# Patient Record
Sex: Female | Born: 1985 | Race: Black or African American | Hispanic: No | Marital: Married | Smoking: Never smoker
Health system: Southern US, Community
[De-identification: ages and names within clinical notes are randomized; demographics above are authoritative.]

## PROBLEM LIST (undated history)

## (undated) ENCOUNTER — Emergency Department (HOSPITAL_COMMUNITY): Admission: EM | Disposition: A | Discharge status: Home / Self Care | Payer: Medicaid Other

## (undated) DIAGNOSIS — I10 Essential (primary) hypertension: Secondary | ICD-10-CM

## (undated) DIAGNOSIS — G43909 Migraine, unspecified, not intractable, without status migrainosus: Secondary | ICD-10-CM

## (undated) DIAGNOSIS — Z789 Other specified health status: Secondary | ICD-10-CM

## (undated) HISTORY — DX: Migraine, unspecified, not intractable, without status migrainosus: G43.909

## (undated) HISTORY — DX: Essential (primary) hypertension: I10

---

## 2009-12-14 ENCOUNTER — Encounter: Admission: RE | Admit: 2009-12-14 | Discharge: 2009-12-14 | Payer: Self-pay | Admitting: Infectious Diseases

## 2010-10-29 ENCOUNTER — Inpatient Hospital Stay (HOSPITAL_COMMUNITY)
Admission: AD | Admit: 2010-10-29 | Discharge: 2010-10-29 | Disposition: A | Discharge status: Home / Self Care | Payer: Medicaid Other | Source: Ambulatory Visit | Attending: Obstetrics & Gynecology | Admitting: Obstetrics & Gynecology

## 2010-10-29 ENCOUNTER — Inpatient Hospital Stay (HOSPITAL_COMMUNITY): Payer: Medicaid Other

## 2010-10-29 DIAGNOSIS — O99891 Other specified diseases and conditions complicating pregnancy: Secondary | ICD-10-CM

## 2010-10-29 DIAGNOSIS — R109 Unspecified abdominal pain: Secondary | ICD-10-CM | POA: Insufficient documentation

## 2010-10-29 DIAGNOSIS — O9989 Other specified diseases and conditions complicating pregnancy, childbirth and the puerperium: Secondary | ICD-10-CM

## 2010-10-29 LAB — URINALYSIS, ROUTINE W REFLEX MICROSCOPIC
Bilirubin Urine: NEGATIVE
Hgb urine dipstick: NEGATIVE
Ketones, ur: NEGATIVE mg/dL
Protein, ur: NEGATIVE mg/dL
Urobilinogen, UA: 0.2 mg/dL (ref 0.0–1.0)

## 2010-10-29 LAB — WET PREP, GENITAL
Clue Cells Wet Prep HPF POC: NONE SEEN
Trich, Wet Prep: NONE SEEN
Yeast Wet Prep HPF POC: NONE SEEN

## 2010-10-29 LAB — CBC
MCV: 79.6 fL (ref 78.0–100.0)
RBC: 4.47 MIL/uL (ref 3.87–5.11)

## 2010-10-29 LAB — ABO/RH: ABO/RH(D): B POS

## 2010-12-05 LAB — HIV ANTIBODY (ROUTINE TESTING W REFLEX): HIV: NONREACTIVE

## 2010-12-05 LAB — ABO/RH: RH Type: NEGATIVE

## 2010-12-05 LAB — HEPATITIS B SURFACE ANTIGEN: Hepatitis B Surface Ag: NEGATIVE

## 2010-12-05 LAB — RUBELLA ANTIBODY, IGM: Rubella: IMMUNE

## 2010-12-05 LAB — RPR: RPR: NONREACTIVE

## 2010-12-07 LAB — GLUCOSE TOLERANCE, 1 HOUR: GTT, 1 hr: 121

## 2010-12-07 LAB — CULTURE, OB URINE

## 2011-01-12 ENCOUNTER — Inpatient Hospital Stay (HOSPITAL_COMMUNITY): Payer: Medicaid Other

## 2011-01-12 ENCOUNTER — Inpatient Hospital Stay (HOSPITAL_COMMUNITY): Payer: Medicaid Other | Admitting: Obstetrics & Gynecology

## 2011-01-12 ENCOUNTER — Encounter (HOSPITAL_COMMUNITY): Payer: Self-pay

## 2011-01-12 ENCOUNTER — Inpatient Hospital Stay (HOSPITAL_COMMUNITY)
Admission: AD | Admit: 2011-01-12 | Discharge: 2011-01-12 | Disposition: A | Discharge status: Home / Self Care | Payer: Medicaid Other | Source: Ambulatory Visit | Attending: Obstetrics & Gynecology | Admitting: Obstetrics & Gynecology

## 2011-01-12 DIAGNOSIS — Z3480 Encounter for supervision of other normal pregnancy, unspecified trimester: Secondary | ICD-10-CM

## 2011-01-12 DIAGNOSIS — R51 Headache: Secondary | ICD-10-CM | POA: Insufficient documentation

## 2011-01-12 DIAGNOSIS — J329 Chronic sinusitis, unspecified: Secondary | ICD-10-CM | POA: Insufficient documentation

## 2011-01-12 DIAGNOSIS — O99891 Other specified diseases and conditions complicating pregnancy: Secondary | ICD-10-CM | POA: Insufficient documentation

## 2011-01-12 DIAGNOSIS — Z331 Pregnant state, incidental: Secondary | ICD-10-CM

## 2011-01-12 DIAGNOSIS — T7840XA Allergy, unspecified, initial encounter: Secondary | ICD-10-CM

## 2011-01-12 LAB — URINALYSIS, ROUTINE W REFLEX MICROSCOPIC
Bilirubin Urine: NEGATIVE
Glucose, UA: NEGATIVE mg/dL
Leukocytes, UA: NEGATIVE
Nitrite: NEGATIVE
Protein, ur: NEGATIVE mg/dL
Specific Gravity, Urine: <1.005 — ABNORMAL LOW (ref 1.005–1.030)
Urobilinogen, UA: 0.2 mg/dL (ref 0.0–1.0)
pH: 7 (ref 5.0–8.0)

## 2011-01-12 LAB — DIFFERENTIAL
Basophils Relative: 0 % (ref 0–1)
Eosinophils Absolute: 0.1 10*3/uL (ref 0.0–0.7)
Lymphocytes Relative: 22 % (ref 12–46)
Monocytes Relative: 9 % (ref 3–12)
Neutro Abs: 5.2 10*3/uL (ref 1.7–7.7)

## 2011-01-12 LAB — COMPREHENSIVE METABOLIC PANEL
ALT: 11 U/L (ref 0–35)
Calcium: 9.3 mg/dL (ref 8.4–10.5)
Creatinine, Ser: 0.55 mg/dL (ref 0.50–1.10)
GFR calc non Af Amer: >60 mL/min (ref >60)
Total Bilirubin: 0.1 mg/dL — ABNORMAL LOW (ref 0.3–1.2)

## 2011-01-12 LAB — CBC
MCH: 27.7 pg (ref 26.0–34.0)
MCHC: 34.1 g/dL (ref 30.0–36.0)
RDW: 13.7 % (ref 11.5–15.5)

## 2011-01-12 LAB — URINE MICROSCOPIC-ADD ON

## 2011-01-12 MED ORDER — DIPHENHYDRAMINE HCL 25 MG PO CAPS
50.0000 mg | ORAL_CAPSULE | Freq: Once | ORAL | Status: AC
  Administered 2011-01-12: 50 mg via ORAL
  Filled 2011-01-12: qty 2

## 2011-01-12 MED ORDER — PREDNISONE (PAK) 10 MG PO TABS: 20.0000 mg | ORAL_TABLET | Freq: Every day | ORAL | Status: DC

## 2011-01-12 MED ORDER — PREDNISONE 20 MG PO TABS
60.0000 mg | ORAL_TABLET | Freq: Once | ORAL | Status: AC
  Administered 2011-01-12: 60 mg via ORAL
  Filled 2011-01-12: qty 3

## 2011-01-12 MED ORDER — DIPHENHYDRAMINE HCL 25 MG PO CAPS: 25.0000 mg | ORAL_CAPSULE | Freq: Four times a day (QID) | ORAL | Status: AC | PRN

## 2011-01-12 NOTE — ED Provider Notes (Signed)
History   The patient speaks some English and does not feel she needs a Nurse, learning disability. She only ask that we speak slowly.  Chief Complaint  Patient presents with  . Sinusitis  . Headache    pt reports facial swelling started on 07/05 and headache started at this time. has had nasal congestion and sneezing and headache is frontal. denies toothache, earache.denies sorethroat   Patient is a 25 y.o. female presenting with sinusitis and headaches. The history is provided by the patient. The history is limited by a language barrier. No language interpreter was used.  Sinusitis  This is a new problem. The problem has been gradually worsening. The maximum temperature recorded prior to her arrival was 100 to 100.9 F. The fever has been present for 5 days or more. The pain is at a severity of 6/10. The pain is moderate. The pain has been constant since onset. Associated symptoms include chills, congestion, sinus pressure and cough. Pertinent negatives include no ear pain and no sore throat. She has tried nothing for the symptoms.  Headache The primary symptoms include headaches and fever. Primary symptoms do not include nausea or vomiting.  The headache is associated with photophobia and eye pain. The headache is not associated with double vision.  Additional symptoms include photophobia.  The headache is located in the left frontal and maxillary area.     No past medical history on file.  Past Surgical History  Procedure Date  . Cesarean section     No family history on file.  History  Substance Use Topics  . Smoking status: Never Smoker   . Smokeless tobacco: Not on file  . Alcohol Use: No    Allergies: No Known Allergies  No prescriptions prior to admission    Review of Systems  Constitutional: Positive for fever and chills.  HENT: Positive for congestion and sinus pressure. Negative for ear pain, nosebleeds, sore throat, neck pain and ear discharge.   Eyes: Positive for photophobia  and pain. Negative for blurred vision and double vision.  Respiratory: Positive for cough.   Gastrointestinal: Negative for nausea, vomiting and abdominal pain.  Genitourinary: Negative for dysuria and urgency.  Musculoskeletal: Positive for myalgias. Negative for back pain.  Skin: Negative for rash.  Neurological: Positive for headaches.   Physical Exam   Blood pressure 145/82, pulse 104, temperature 99.1 F (37.3 C), temperature source Oral, height 5\' 4"  (1.626 m), weight 215 lb 3.2 oz (97.614 kg).  Physical Exam  Constitutional: She is oriented to person, place, and time. She appears well-developed and well-nourished.  HENT:  Head: Head is with left periorbital erythema.  Right Ear: External ear normal. No drainage, swelling or tenderness. Tympanic membrane is not perforated, not erythematous and not bulging.  Left Ear: External ear normal. No drainage, swelling or tenderness. Tympanic membrane is not perforated, not erythematous and not bulging.  Nose: Mucosal edema, rhinorrhea and sinus tenderness present. No epistaxis.  No foreign bodies. Right sinus exhibits no maxillary sinus tenderness and no frontal sinus tenderness. Left sinus exhibits maxillary sinus tenderness and frontal sinus tenderness.  Eyes: EOM are normal. Right eye exhibits no discharge, no exudate and no hordeolum. Left eye exhibits no discharge, no exudate and no hordeolum. Right conjunctiva is not injected. Left conjunctiva is injected. Right eye exhibits normal extraocular motion and no nystagmus. Left eye exhibits normal extraocular motion and no nystagmus.    Neck: Neck supple.  Respiratory: Effort normal.  GI: Soft. There is no tenderness.  Gravid at 19.3 wks. Gest +FHT  Musculoskeletal: Normal range of motion. She exhibits no edema.  Neurological: She is alert and oriented to person, place, and time. She has normal strength and normal reflexes. No cranial nerve deficit. Coordination normal.  Skin: Skin is  warm and dry.    MAU Course  Procedures Dr. Penne Lash in to evaluate patient with me. We will order labs, CT of sinuses, benadryl and prednisone.  MDM Pt. Feeling better after benadryl and prednisone. Discussed with Dr. Penne Lash and CT results normal. Will d/c pt. Home to follow up with her PCP tomorrow. Will tx with prednisone and benadryl Rx.    [place], NP 01/12/11 [address] [place] 01/12/11 1718

## 2011-01-12 NOTE — Consult Note (Signed)
Dr. Penne Lash in MAU and evaluated patient with me. Agrees with assessment and current plan of care. Pt. Awaiting CT of sinuses.

## 2011-01-12 NOTE — Progress Notes (Signed)
Left eye and left side of face swelling, headaches, weakness

## 2011-01-23 ENCOUNTER — Encounter (HOSPITAL_COMMUNITY): Payer: Self-pay | Admitting: Advanced Practice Midwife

## 2011-01-23 ENCOUNTER — Inpatient Hospital Stay (HOSPITAL_COMMUNITY)
Admission: AD | Admit: 2011-01-23 | Discharge: 2011-01-23 | Disposition: A | Discharge status: Home / Self Care | Payer: Medicaid Other | Source: Ambulatory Visit | Attending: Obstetrics & Gynecology | Admitting: Obstetrics & Gynecology

## 2011-01-23 DIAGNOSIS — O10019 Pre-existing essential hypertension complicating pregnancy, unspecified trimester: Secondary | ICD-10-CM | POA: Insufficient documentation

## 2011-01-23 DIAGNOSIS — O169 Unspecified maternal hypertension, unspecified trimester: Secondary | ICD-10-CM

## 2011-01-23 LAB — COMPREHENSIVE METABOLIC PANEL
Albumin: 3.1 g/dL — ABNORMAL LOW (ref 3.5–5.2)
BUN: 5 mg/dL — ABNORMAL LOW (ref 6–23)
Chloride: 99 mEq/L (ref 96–112)
Creatinine, Ser: 0.55 mg/dL (ref 0.50–1.10)
GFR calc Af Amer: >60 mL/min (ref >60)
GFR calc non Af Amer: >60 mL/min (ref >60)
Glucose, Bld: 97 mg/dL (ref 70–99)
Potassium: 4.1 mEq/L (ref 3.5–5.1)
Sodium: 133 mEq/L — ABNORMAL LOW (ref 135–145)

## 2011-01-23 LAB — CBC
HCT: 36.4 % (ref 36.0–46.0)
MCH: 27.6 pg (ref 26.0–34.0)
Platelets: 202 10*3/uL (ref 150–400)
RBC: 4.45 MIL/uL (ref 3.87–5.11)
RDW: 13.9 % (ref 11.5–15.5)
WBC: 8.9 10*3/uL (ref 4.0–10.5)

## 2011-01-23 NOTE — Progress Notes (Signed)
PT WAS AT [facility] AND WAS NOTED TO HAVE ELEVATED B/P. WAS SENT TO MAU FOR FURTHER EVALUATION.

## 2011-01-23 NOTE — Progress Notes (Signed)
Since Mon, having difficulty breathing.  Has been sweating a lot.  BP checked at work, told BP and pulse both high. Denies hx of HTN.

## 2011-01-24 ENCOUNTER — Telehealth: Payer: Self-pay | Admitting: Family Medicine

## 2011-01-24 ENCOUNTER — Other Ambulatory Visit: Payer: Self-pay | Admitting: Obstetrics & Gynecology

## 2011-01-24 DIAGNOSIS — O10919 Unspecified pre-existing hypertension complicating pregnancy, unspecified trimester: Secondary | ICD-10-CM

## 2011-01-24 NOTE — Telephone Encounter (Signed)
Pt returned 24-hr urine to MAU as instructed and needed order placed in [facility] for processing. [employer] opened to do this.

## 2011-02-03 ENCOUNTER — Encounter (HOSPITAL_COMMUNITY): Payer: Self-pay | Admitting: *Deleted

## 2011-02-03 ENCOUNTER — Inpatient Hospital Stay (HOSPITAL_COMMUNITY)
Admission: AD | Admit: 2011-02-03 | Discharge: 2011-02-03 | Disposition: A | Discharge status: Home / Self Care | Payer: Medicaid Other | Source: Ambulatory Visit | Attending: Obstetrics and Gynecology | Admitting: Obstetrics and Gynecology

## 2011-02-03 DIAGNOSIS — N949 Unspecified condition associated with female genital organs and menstrual cycle: Secondary | ICD-10-CM | POA: Insufficient documentation

## 2011-02-03 DIAGNOSIS — O9989 Other specified diseases and conditions complicating pregnancy, childbirth and the puerperium: Secondary | ICD-10-CM | POA: Insufficient documentation

## 2011-02-03 DIAGNOSIS — R1032 Left lower quadrant pain: Secondary | ICD-10-CM | POA: Insufficient documentation

## 2011-02-03 HISTORY — DX: Other specified health status: Z78.9

## 2011-02-03 LAB — URINALYSIS, ROUTINE W REFLEX MICROSCOPIC
Bilirubin Urine: NEGATIVE
Hgb urine dipstick: NEGATIVE
Specific Gravity, Urine: 1.01 (ref 1.005–1.030)
Urobilinogen, UA: 0.2 mg/dL (ref 0.0–1.0)
pH: 7.5 (ref 5.0–8.0)

## 2011-02-03 NOTE — Progress Notes (Signed)
Ok when sits, hurts and feel pain when stands.  Pain is lower abd  On left side.

## 2011-02-03 NOTE — ED Provider Notes (Signed)
History     Chief Complaint  Patient presents with  . Abdominal Pain   HPI  Presents at 22 weeks. with c/o intermittent sharp pains on LLQ.  States it only hurts when she walks. Does not hurt at other times. Denies leaking or bleeding. Denies contractions. [facility] No complications with pregnancy other than Hypertension.   Past Medical History  Diagnosis Date  . No pertinent past medical history     Past Surgical History  Procedure Date  . Cesarean section     No family history on file.  History  Substance Use Topics  . Smoking status: Never Smoker   . Smokeless tobacco: Not on file  . Alcohol Use: No    Allergies: No Known Allergies  Prescriptions prior to admission  Medication Sig Dispense Refill  . acetaminophen (TYLENOL) 500 MG tablet Take 1,000 mg by mouth every 6 (six) hours as needed. Pain        . prenatal vitamin w/FE, FA (PRENATAL 1 + 1) 27-1 MG TABS Take 1 tablet by mouth daily.          ROS Intermittent sharp LLQ pains. Denies leaking or bleeding.  Physical Exam   Blood pressure 145/89, pulse 119, temperature 98.5 F (36.9 C), temperature source Oral, resp. rate 20, height 5\' 4"  (1.626 m), weight 100.517 kg (221 lb 9.6 oz).  Physical Exam  Respirations unlabored. Abdomen soft and nontender.  Gravid in contour. Fundal Height 22cm Very slight tenderness in LLQ with deep palpation. FHR 150s. Cervix long and closed No discharge or bleeding.    MAU Course  Procedures   Assessment and Plan  IUP at 22 weeks Round Ligament Pain No evidence of preterm labor  Plan:  WIll d/c home with comfort measures reviewed.  Wynelle Bourgeois 02/03/2011, 2:28 PM

## 2011-02-04 NOTE — ED Provider Notes (Signed)
Agree with above note.  Ann Frey 02/04/2011 7:11 AM

## 2011-02-17 NOTE — ED Provider Notes (Signed)
Seen previous day for round ligament pain. See prior note.

## 2011-02-28 ENCOUNTER — Encounter: Payer: Self-pay | Admitting: Pulmonary Disease

## 2011-02-28 DIAGNOSIS — I10 Essential (primary) hypertension: Secondary | ICD-10-CM

## 2011-02-28 NOTE — Progress Notes (Signed)
Pt needs to see Nutrition and Child psychotherapist at visit.

## 2011-03-03 ENCOUNTER — Ambulatory Visit (INDEPENDENT_AMBULATORY_CARE_PROVIDER_SITE_OTHER): Payer: Medicaid Other | Admitting: Family Medicine

## 2011-03-03 ENCOUNTER — Other Ambulatory Visit: Payer: Self-pay | Admitting: Obstetrics and Gynecology

## 2011-03-03 DIAGNOSIS — O0990 Supervision of high risk pregnancy, unspecified, unspecified trimester: Secondary | ICD-10-CM

## 2011-03-03 DIAGNOSIS — O169 Unspecified maternal hypertension, unspecified trimester: Secondary | ICD-10-CM

## 2011-03-03 NOTE — Progress Notes (Signed)
[facility] for PIH.  Had 24 hour urine, PIH baseline labs done earlier this week at [facility].  Will request records.   Previous delivery by c-section in [place].  Not certain of reason why.   Occasional HA.  Denies vision changes, vag discharge.

## 2011-03-03 NOTE — Progress Notes (Signed)
Nutrition Note: Referred for 1st [facility] visit, Dx. PIH, Obesity; patient has WIC. Current weight gain of 29.3# is excessive, plots 16# > expected for weeks gest. Recommended weight gain 11-20#. Intake- Reports 3 meals and 3 snacks, well balanced diet, does not add sodium to foods. Reports plenty of water and limited high sugar beverages.  Reports no physical activity. Plans to increase activity to 3 times/week as able. Will limit intake to 5 smaller meals.  Follow up if referred. Cy Blamer, RD

## 2011-03-03 NOTE — Progress Notes (Signed)
Pt needs to see nutrition and Child psychotherapist. Pt states had stomach ache on Sat and pain in lower abdomen on Sunday. No vaginal discharge. Pulse 126.

## 2011-03-12 ENCOUNTER — Encounter: Payer: Self-pay | Admitting: Family Medicine

## 2011-03-17 ENCOUNTER — Encounter: Payer: Self-pay | Admitting: Obstetrics & Gynecology

## 2011-03-17 ENCOUNTER — Ambulatory Visit (INDEPENDENT_AMBULATORY_CARE_PROVIDER_SITE_OTHER): Payer: Medicaid Other | Admitting: Advanced Practice Midwife

## 2011-03-17 DIAGNOSIS — O10919 Unspecified pre-existing hypertension complicating pregnancy, unspecified trimester: Secondary | ICD-10-CM

## 2011-03-17 DIAGNOSIS — O0990 Supervision of high risk pregnancy, unspecified, unspecified trimester: Secondary | ICD-10-CM

## 2011-03-17 DIAGNOSIS — O169 Unspecified maternal hypertension, unspecified trimester: Secondary | ICD-10-CM

## 2011-03-17 LAB — CBC
HCT: 37.3 % (ref 36.0–46.0)
Hemoglobin: 12.5 g/dL (ref 12.0–15.0)
MCH: 27.4 pg (ref 26.0–34.0)
MCHC: 33.5 g/dL (ref 30.0–36.0)
MCV: 81.8 fL (ref 78.0–100.0)
Platelets: 216 10*3/uL (ref 150–400)
RBC: 4.56 MIL/uL (ref 3.87–5.11)
RDW: 13.3 % (ref 11.5–15.5)
WBC: 6.9 10*3/uL (ref 4.0–10.5)

## 2011-03-17 LAB — POCT URINALYSIS DIP (DEVICE)
Bilirubin Urine: NEGATIVE
Hgb urine dipstick: NEGATIVE
Ketones, ur: NEGATIVE mg/dL
Leukocytes, UA: NEGATIVE
Protein, ur: NEGATIVE mg/dL
pH: 6.5 (ref 5.0–8.0)

## 2011-03-17 LAB — RPR

## 2011-03-17 LAB — GLUCOSE TOLERANCE, 1 HOUR: Glucose, 1 Hour GTT: 166 mg/dL — ABNORMAL HIGH (ref 70–140)

## 2011-03-17 LAB — HIV ANTIBODY (ROUTINE TESTING W REFLEX): HIV: NONREACTIVE

## 2011-03-17 MED ORDER — LABETALOL HCL 100 MG PO TABS: 100.0000 mg | ORAL_TABLET | Freq: Two times a day (BID) | ORAL | Status: DC

## 2011-03-17 MED ORDER — RHO D IMMUNE GLOBULIN 300 MCG IM INJ: 300.0000 ug | INJECTION | Freq: Once | INTRAMUSCULAR | Status: DC

## 2011-03-17 NOTE — Progress Notes (Signed)
No vaginal discharge. Pulse 116. When standing for long time, pt states hard for her to breathe.

## 2011-03-17 NOTE — Patient Instructions (Addendum)
Pregnancy - Third Trimester The third trimester begins at the 28th week of pregnancy and ends at birth. It is important to follow your doctor's instructions. HOME CARE  Keep your doctor's appointments.   Do not smoke.   Do not drink alcohol or use drugs.   Only take medicine the doctor tells you to take.   Take prenatal vitamins as directed. The vitamin should contain 1 milligram of folic acid.   Exercise.   Eat healthy foods. Eat regular, well-balanced meals.   You can have sex (intercourse) if there are no other problems with the pregnancy.   Do not use hot tubs, steam rooms, or saunas.   Wear a seat belt while driving.   Avoid raw meat, uncooked cheese, and litter boxes and soil used by cats.   Rest with your legs raised (elevated).   Make a list of emergency phone numbers. Keep this list with you.   Arrange for help when you come back home after delivering the baby.   Make a trial run to the hospital.   Take prenatal classes.   Prepare the baby's nursery.   Do not travel out of the city. If you absolutely have to, get permission from your doctor first.   Wear flat shoes. Do not wear high heels.  GET HELP IF:  You have any concerns or worries during your pregnancy.  GET HELP RIGHT AWAY IF:  You have a temperature by mouth above 101.0, not controlled by medicine.   You have not felt the baby move for more than 1 hour. If you think the baby is not moving as much as normal, eat something with sugar in it or lie down on your left side for an hour. The baby should move at least 4 to 5 times per hour.   Fluid is coming from the vagina.   Blood is coming from the vagina. Light spotting is common, especially after sex (intercourse).   You have belly (abdominal) pain.   You have a bad smelling fluid (discharge) coming from the vagina. The fluid changes from clear to white.   You still feel sick to your stomach (nauseous).   You throw up (vomit) more than 24  hours.   You have the chills.   You have shortness of breath.   You have a burning feeling when you pee (urinate).   You loose or gain more than 2 pounds (0.9 kilograms) of weight over a weeks time, or as suggested by your doctor.   Your face, hands, feet, or legs get puffy (swell).   You have a bad headache that will not go away.   You start to have problems seeing (blurry or double vision).   You fall, are in a car accident, or have any kind of trauma.   There is mental or physical violence at home.  MAKE SURE YOU:   Understand these instructions.   Will watch your condition.   Will get help right away if you are not doing well or get worse.  Document Released: 09/17/2009  [facility] Patient Information 2011 [place], [place]. Hypertension In Pregnancy High Blood Pressure in Pregnancy You can develop high blood pressure (hypertension) in the last half of your pregnancy (20 weeks of gestation or later). This can happen even if your blood pressure was normal before pregnancy. This condition is usually monitored closely because pregnancy-induced hypertension (PIH) can be associated with more serious problems. PIH can be a sign of a complication of pregnancy called preeclampsia.  This is especially true if you have protein in your urine. If preeclampsia progresses, it can cause convulsions during your pregnancy (eclampsia). It can also become HELLP (hemolysis, elevated liver enzymes, and low platelet count) syndrome, which can lead to abnormalities of the liver, abnormalities of blood clotting, increased high blood pressure, and increased protein in the urine. Both conditions are very serious and can threaten the life of a mother and her baby. A woman who has high blood pressure before getting pregnant can also get superimposed preeclampsia, which is usually a more severe form of preeclampsia. HOME CARE INSTRUCTIONS  Make sure you understand these instructions and ask your caregiver any  questions you have.   Follow your caregiver's instructions carefully.   Take medications as directed by your caregiver.  SEEK IMMEDIATE MEDICAL CARE IF YOU HAVE:  Stomach pain.   Sudden and excessive swelling in the hands, ankles, or face.   Weight gain of 4 pounds (1.8 kg) or more within 1 week.   Repeated vomiting.   Vaginal bleeding.   A lack of fetal movement.   Headache.   Vision problems (blurred or double vision).   Muscle twitching or spasm.   Shortness of breath.   Blue fingernails and lips.   Blood in the urine.  Document Released: 06/23/2005 Document Re-Released: 12/11/2009 [facility] Patient Information 2011 [place], [place].

## 2011-03-17 NOTE — Progress Notes (Addendum)
Pt reports some occasional shortness of breath, no dyspnea, resolves with rest/taking off clothes. Rev'd normal shortness of breath in late pregnancy vs. Warning signs, rev'd comfort measures. 28 wk labs and rhogam today. U/S ordered. Discussed VBAC vs. Rpt c/s - pt interested in TOLAC. BP elevated. No headache, vision change, abd pain. Start Labetalol 100mg  bid. Rev'd with Dr. Debroah Loop, agrees with plan.

## 2011-03-17 NOTE — Progress Notes (Addendum)
Addended by: Archie Patten on: 03/17/2011 09:26 AM  Modules accepted: Orders

## 2011-03-19 ENCOUNTER — Telehealth: Payer: Self-pay | Admitting: *Deleted

## 2011-03-19 NOTE — Telephone Encounter (Signed)
Message copied by Jill Side on Wed Mar 19, 2011  9:38 AM ------      Message from: Archie Patten      Created: Wed Mar 19, 2011  9:19 AM      Regarding: Needs 3 hour GTT       Please call patient, needs 3 hour GTT      ----- Message -----         From: [facility] [place] Interface         Sent: 03/17/2011   5:12 PM           To: Georges Mouse, CNM

## 2011-03-19 NOTE — Telephone Encounter (Signed)
Called pt- discussed need for 3hr GTT. Pt will come in tomorrow @ 0800 for test. She voiced understanding of instructions.

## 2011-03-20 ENCOUNTER — Other Ambulatory Visit: Payer: Medicaid Other

## 2011-03-21 LAB — GLUCOSE TOLERANCE, 3 HOURS
Glucose Tolerance, 1 hour: 162 mg/dL (ref 70–189)
Glucose Tolerance, 2 hour: 134 mg/dL (ref 70–164)
Glucose Tolerance, Fasting: 94 mg/dL (ref 70–104)

## 2011-03-24 ENCOUNTER — Ambulatory Visit (HOSPITAL_COMMUNITY)
Admission: RE | Admit: 2011-03-24 | Discharge: 2011-03-24 | Disposition: A | Discharge status: Home / Self Care | Payer: Medicaid Other | Source: Ambulatory Visit | Attending: Advanced Practice Midwife | Admitting: Advanced Practice Midwife

## 2011-03-24 ENCOUNTER — Ambulatory Visit (INDEPENDENT_AMBULATORY_CARE_PROVIDER_SITE_OTHER): Payer: Medicaid Other | Admitting: Obstetrics and Gynecology

## 2011-03-24 VITALS — BP 133/83 | Temp 97.1°F | Wt 225.5 lb

## 2011-03-24 DIAGNOSIS — O10919 Unspecified pre-existing hypertension complicating pregnancy, unspecified trimester: Secondary | ICD-10-CM

## 2011-03-24 DIAGNOSIS — O0990 Supervision of high risk pregnancy, unspecified, unspecified trimester: Secondary | ICD-10-CM

## 2011-03-24 DIAGNOSIS — O34219 Maternal care for unspecified type scar from previous cesarean delivery: Secondary | ICD-10-CM | POA: Insufficient documentation

## 2011-03-24 DIAGNOSIS — O169 Unspecified maternal hypertension, unspecified trimester: Secondary | ICD-10-CM

## 2011-03-24 DIAGNOSIS — O10019 Pre-existing essential hypertension complicating pregnancy, unspecified trimester: Secondary | ICD-10-CM | POA: Insufficient documentation

## 2011-03-24 DIAGNOSIS — O360990 Maternal care for other rhesus isoimmunization, unspecified trimester, not applicable or unspecified: Secondary | ICD-10-CM

## 2011-03-24 DIAGNOSIS — O26899 Other specified pregnancy related conditions, unspecified trimester: Secondary | ICD-10-CM

## 2011-03-24 DIAGNOSIS — Z6791 Unspecified blood type, Rh negative: Secondary | ICD-10-CM

## 2011-03-24 LAB — POCT URINALYSIS DIP (DEVICE)
Ketones, ur: NEGATIVE mg/dL
Leukocytes, UA: NEGATIVE
Nitrite: NEGATIVE
Protein, ur: NEGATIVE mg/dL
Urobilinogen, UA: 0.2 mg/dL (ref 0.0–1.0)
pH: 7 (ref 5.0–8.0)

## 2011-03-24 MED ORDER — RHO D IMMUNE GLOBULIN 1500 UNIT/2ML IJ SOLN: 300.0000 ug | Freq: Once | INTRAMUSCULAR | Status: DC

## 2011-03-24 NOTE — Progress Notes (Signed)
Pt is B negative and has never received Rhogam. Pt is unaware of this. No vaginal discharge. Pulse -108 Pt did receive 28 week labs but no rhogam shot.

## 2011-03-24 NOTE — Progress Notes (Signed)
Patient without complaints. FM/PTL precautions reviewed. Results of ultrasound and 3hr GTT also reviewed. Patient with 2 different Rh status on file. Will repeat again today before giving Rhogam. Patient did not receive Rhogma with last pregnancy and stated that her blood type was B+. Patient provided information on TOLAC. Patient will decide at her next visit.

## 2011-03-24 NOTE — Patient Instructions (Addendum)
Pregnancy - Hypertension                      (High Blood Pressure, Preeclampsia)                              High blood pressure (hypertension) that develops after 20 weeks of pregnancy is called preeclampsia. Preeclampsia is a blood pressure that is 140 or higher systolic, or 90 or higher diastolic that develops after 20 weeks of pregnancy along with protein (0.3 grams or higher in 24 hours) in the urine. Preeclampsia can be life-threatening to the mother and baby. Babies are smaller and more likely to be born prematurely when the mother's blood pressure is elevated. In extreme cases, the mother may die or the baby may die while still in the uterus. In preeclampsia, the rise in blood pressure is caused by the pregnancy and returns to normal after the baby is delivered.   Preeclampsia is known by other names including:  Toxemia of pregnancy.  Eclampsia (high blood pressure with convulsions).  Pregnancy induced hypertension.   Gestational hypertension is used to replace the term "pregnancy induced hypertension."  HEELP Syndrome. HEELP syndrome is present when: l The systolic blood pressure is 160 or higher or the diastolic blood pressure is 110 or higher on two occasions within 6 hours during bed rest. l The 24 hour protein in the urine is 5 grams or 3+ or higher on two random samples. l The 24 hour urine out put is less than 500 ml. l There are cerebral (severe headache) or visual problems (blurred or double vision). l There is cyanosis (blue lips and finger nails) because of the lungs filling up with fluid (pulmonary edema). l There is upper abdominal pain. l There are abnormal liver function tests. l There is a low platelet count. l The baby is not growing normally. l There is abruption (premature separation) of the placenta.  High blood pressure that was present before the pregnancy (chronic hypertension) and preeclampsia are different conditions. Chronic hypertension continues  following delivery. With chronic hypertension, there is no protein in the urine. You can get superimposed preeclampsia with chronic hypertension, and this puts the pregnancy at more risk.   CAUSES The exact cause of preeclampsia is unknown. It may be a  immune reaction in the body and it also may be related to trophoblastic cells that invade the placenta (the more the invasion the higher the blood pressure). Preeclampsia is a much more dangerous condition than chronic hypertension. With preeclampsia there are more changes in the mother's body than just having chronic high blood pressure. There is a shift in chemical makeup. This shift can lead to blood clotting, seizures,  problems in the mother and more as stated in HEELP Syndrome and also problems with the fetus, low birth weight, premature birth, seizures and death in the pregnant patient or the fetus.   RISK FACTORS THAT MAY LEAD TO PREECLAMPSIA:  First pregnancy.  Having preeclampsia in a previous pregnancy.  Having chronic hypertension.  Having twins or more with pregnancy.  Being over age 58 when getting pregnant.  Being African American.  If you have kidney disease.   If you have diabetes.  If you have lupus or other blood diseases.  If you are obese.    SYMPTOMS The most common symptoms found in patients with preeclampsia are:  High blood pressure.  Swelling of the hands, face  and brain (edema).  Losing protein in the urine.  Overactive reflexes (hyperreflexia).  Increase uric acid in the blood.  Sudden weight gain (5 to 10 pounds in one week).  Dizziness.  Increased deep tendon reflexes (the knee-jerk).  Visual problems.  Abdominal pain.  Headache.  Nausea and vomiting.  Numbness in the face, hands and feet.  Slurred speech.  The symptoms listed above are serious and must be evaluated before they cause several other problems. These include:   Seizures.    Trouble breathing.  Death of the mother or  fetus.   Low urine output (oliguria).  Low platelet count.   Not enough fluid in the amniotic sac (Oligohydramnios).  Bleeding because blood is not clotting normally (low platelet count).  Abnormal liver function.  Hemolysis, breakdown of red blood cells.  Retinal detachment.  Brain hemorrhage.    DIAGNOSIS   A rise in the systolic number (the first number) of 30 or more and/or a rise of the diastolic number (the second number) of 15 or more can mean a problem is developing. A blood pressure that usually runs about 100/60 and then goes up to 130/90 is a concern. If this happens, the mother needs more checking, blood and other tests need to be performed and the baby needs to be monitored. You also need to be watched closely and even admitted into the hospital if necessary.  A 24-hour urine test will likely be done. This test measures how much protein is in the urine. The amount of protein in the urine increases due to the changes in the kidneys. Measuring the protein helps determine how bad the problem may be.  An increase in the uric acid in the blood may indicate preeclampsia.  Blood tests may show blood clotting abnormalities and liver function to be impaired. This can be a sign of a much more serious problems known as the HELLP syndrome.  Over active reflexes are also another way of knowing preeclampsia  is developing.  Patients with pre-existing hypertension who also get preeclampsia must be careful as this is an extremely dangerous condition.    TREATMENT The goal of treatment is to get the baby as close to maturity as possible before having to deliver.   Bed rest.  Frequent blood pressure tests.   24 hour urine samples.  Frequent mother and fetal monitoring.   Blood tests, ultrasounds, fetal monitoring, non-stress tests and biophysical profiles are used to watch for signs that your baby may be in danger. If the baby's life is threatened, delivery may be by induction of  labor or cesarean section. A pregnant woman with severe preeclampsia should be managed by an obstetrician who is a specialist in maternal-fetal medicine.  If you are not hospitalized, frequent prenatal visits to check you and your baby are necessary.   Blood pressure medications may be given. This alone does not stop preeclampsia.   Liver studies are done to make sure liver function is satisfactory.  Coagulation studies may be done. This is to make sure that platelet function is satisfactory.  Magnesium sulfate may be used to decrease the risk of seizures. It also helps control blood pressure. It is a safe medicine but needs careful monitoring. Levels that are too high will cause depressed breathing.  l It also may be useful in stopping premature labor.   If you have severe preeclampsia, delivery is necessary. Regardless of the stage of the pregnancy, the baby has a better chance of surviving outside the  mother.  l An amniocentesis can help determine the baby's lung maturity.  l If the baby's lungs are not mature, steroids can be given to the mother 24 to 48 hours before the delivery. Steroids will help mature the baby's lungs.   PREVENTION  There is no sure way known that will prevent preeclampsia.  Taking 1000 mg of vitamin C and 400 mg of vitamin E a day may be helpful in preventing preeclampsia. More studies are needed to show that this treatment will help.    HOME CARE INSTRUCTIONS  Rest in bed.  Eat a diet that is low in salt.  Frequent blood pressures should be taken and recorded. Bring these to your caregiver's office with you during prenatal checks.  24-hour urine collections may be necessary to follow the disease.  Take medications and testing for you and your baby as recommended by your caregiver.  Be sure to go to all your follow-up appointments as recommended.  Avoid stressful situations.  You may have to quit working.   SEEK MEDICAL CARE: Women who have  preeclampsia are unable to get to the caregiver for frequent evaluation (2 to 3 times a week) or appear to be getting worse or develop severe preeclampsia (HEELP syndrome), should be admitted into a hospital for care.   SEEK IMMEDIATE MEDICAL CARE IF:  You have abdominal pain.  You have a seizure or irritability or jumpiness.  You have visual disturbances.   You have severe swelling of the ankles and face.  You have repeated vomiting.  You have vaginal bleeding.  You have a headache that is continuous or severe.  You have contractions.  You have shortness of breath.  You develop a stiff neck or numbness of the face, hands or feet.  You develop slurred speech.  You have dizziness.  You gain 5 pounds or more in one week.  You begin to bruise easily.  You pass out.  You do not feel the baby moving.   Document Released: 12/08/2006  Document Re-Released: 09/19/2008  [facility] Patient Information 2011 [place], [place].

## 2011-03-26 ENCOUNTER — Telehealth: Payer: Self-pay | Admitting: *Deleted

## 2011-03-26 NOTE — Telephone Encounter (Signed)
Pt left message stating that she had received a message from [facility] on [address] about getting an injection. They said in the message that she should check and see if we wanted to give the medication @ the clinic. Also, pt wants to know blood test results. I called pt and notified her of blood type is B+. She will not need Rhogam and the order has been cancelled. Pt voiced understanding.

## 2011-03-31 ENCOUNTER — Telehealth: Payer: Self-pay | Admitting: *Deleted

## 2011-03-31 ENCOUNTER — Ambulatory Visit (INDEPENDENT_AMBULATORY_CARE_PROVIDER_SITE_OTHER): Payer: Medicaid Other | Admitting: Physician Assistant

## 2011-03-31 ENCOUNTER — Other Ambulatory Visit: Payer: Self-pay | Admitting: Obstetrics & Gynecology

## 2011-03-31 DIAGNOSIS — O10019 Pre-existing essential hypertension complicating pregnancy, unspecified trimester: Secondary | ICD-10-CM

## 2011-03-31 DIAGNOSIS — O0990 Supervision of high risk pregnancy, unspecified, unspecified trimester: Secondary | ICD-10-CM

## 2011-03-31 DIAGNOSIS — O10919 Unspecified pre-existing hypertension complicating pregnancy, unspecified trimester: Secondary | ICD-10-CM

## 2011-03-31 MED ORDER — CYCLOBENZAPRINE HCL 10 MG PO TABS: 10.0000 mg | ORAL_TABLET | Freq: Three times a day (TID) | ORAL | Status: AC | PRN

## 2011-03-31 MED ORDER — LABETALOL HCL 100 MG PO TABS: 200.0000 mg | ORAL_TABLET | Freq: Two times a day (BID) | ORAL | Status: DC

## 2011-03-31 NOTE — Telephone Encounter (Signed)
Pt left message stating that the med ordered for her today is going to cost $117. She has no insurance and cannot afford it. She would like a lesser expensive medication ordered. I called pt and informed her that I have sent a message to [facility] for a new prescription. She will be contacted by her pharmacy when it is ready.

## 2011-03-31 NOTE — Patient Instructions (Signed)
Hypertension In Pregnancy  High Blood Pressure in Pregnancy  You can develop high blood pressure (hypertension) in the last half of your pregnancy (20 weeks of gestation or later). This can happen even if your blood pressure was normal before pregnancy. This condition is usually monitored closely because pregnancy-induced hypertension (PIH) can be associated with more serious problems. PIH can be a sign of a complication of pregnancy called preeclampsia. This is especially true if you have protein in your urine. If preeclampsia progresses, it can cause convulsions during your pregnancy (eclampsia). It can also become HELLP (hemolysis, elevated liver enzymes, and low platelet count) syndrome, which can lead to abnormalities of the liver, abnormalities of blood clotting, increased high blood pressure, and increased protein in the urine. Both conditions are very serious and can threaten the life of a mother and her baby. A woman who has high blood pressure before getting pregnant can also get superimposed preeclampsia, which is usually a more severe form of preeclampsia.  HOME CARE INSTRUCTIONS   Make sure you understand these instructions and ask your caregiver any questions you have.    Follow your caregiver's instructions carefully.    Take medications as directed by your caregiver.   SEEK IMMEDIATE MEDICAL CARE IF YOU HAVE:   Stomach pain.    Sudden and excessive swelling in the hands, ankles, or face.    Weight gain of 4 pounds (1.8 kg) or more within 1 week.    Repeated vomiting.    Vaginal bleeding.    A lack of fetal movement.    Headache.    Vision problems (blurred or double vision).    Muscle twitching or spasm.    Shortness of breath.    Blue fingernails and lips.    Blood in the urine.   Document Released: 06/23/2005 Document Re-Released: 12/11/2009  [employer] Patient Information 2011 [employer].

## 2011-03-31 NOTE — Progress Notes (Signed)
Pulse 95. No vaginal discharge.

## 2011-03-31 NOTE — Progress Notes (Signed)
No proteinuria, + HA daily w/o visual disturbance. Describes HA as frontal/temportal with "pounding" Will increase Labetalol to 200mg  BID and Rx for flexeril. Will start antenatal testing.

## 2011-04-01 ENCOUNTER — Encounter: Payer: Self-pay | Admitting: *Deleted

## 2011-04-01 DIAGNOSIS — O139 Gestational [pregnancy-induced] hypertension without significant proteinuria, unspecified trimester: Secondary | ICD-10-CM | POA: Insufficient documentation

## 2011-04-01 NOTE — Telephone Encounter (Signed)
Pt needs alternate Rx for Labetolol

## 2011-04-01 NOTE — Telephone Encounter (Signed)
Which medication was too expensive? She had two Rx send to the pharmacy.Marland KitchenMarland KitchenLabetolol and Flexeril

## 2011-04-03 NOTE — Telephone Encounter (Signed)
Labetalol should be ~ $30/month...she cannot afford that?

## 2011-04-08 MED ORDER — METHYLDOPA 500 MG PO TABS: 500.0000 mg | ORAL_TABLET | Freq: Two times a day (BID) | ORAL | Status: DC

## 2011-04-08 NOTE — Telephone Encounter (Signed)
Addended by: Jill Side on: 04/08/2011 04:37 PM   Modules accepted: Orders

## 2011-04-08 NOTE — Telephone Encounter (Signed)
Spoke w/pt re: cost of her medication. She states that she can only afford $15 maximum per month per Rx. I obtained alternate medication order from Dr. Macon Large. Pt will pick up from pharmacy. I also informed pt that her disability papers are ready for pick up. Pt voiced understanding.

## 2011-04-14 ENCOUNTER — Encounter: Payer: Self-pay | Admitting: Family Medicine

## 2011-04-14 ENCOUNTER — Ambulatory Visit (INDEPENDENT_AMBULATORY_CARE_PROVIDER_SITE_OTHER): Payer: Medicaid Other | Admitting: Family Medicine

## 2011-04-14 ENCOUNTER — Other Ambulatory Visit: Payer: Self-pay | Admitting: Obstetrics & Gynecology

## 2011-04-14 DIAGNOSIS — O0990 Supervision of high risk pregnancy, unspecified, unspecified trimester: Secondary | ICD-10-CM

## 2011-04-14 DIAGNOSIS — Z23 Encounter for immunization: Secondary | ICD-10-CM

## 2011-04-14 DIAGNOSIS — O139 Gestational [pregnancy-induced] hypertension without significant proteinuria, unspecified trimester: Secondary | ICD-10-CM

## 2011-04-14 LAB — POCT URINALYSIS DIP (DEVICE)
Hgb urine dipstick: NEGATIVE
Protein, ur: NEGATIVE mg/dL
Specific Gravity, Urine: 1.02 (ref 1.005–1.030)
Urobilinogen, UA: 0.2 mg/dL (ref 0.0–1.0)
pH: 7 (ref 5.0–8.0)

## 2011-04-14 MED ORDER — INFLUENZA VIRUS VACC SPLIT PF IM SUSP: 0.5000 mL | Freq: Once | INTRAMUSCULAR | Status: DC

## 2011-04-14 NOTE — Progress Notes (Signed)
Addended by: Faythe Casa on: 04/14/2011 10:18 AM   Modules accepted: Orders

## 2011-04-14 NOTE — Progress Notes (Signed)
Pt has some pelvic pressure. Pt would like flu vaccine today, consent signed.

## 2011-04-14 NOTE — Progress Notes (Signed)
NST reviewed: Category 1. Having some irregular contractions.   Productive cough x 3 days, no pleurisy, fevers, sob.  Mild nasal congestion.  Lungs CTA bilaterally.  No rales or egophony. Likely viral respiratory infection.

## 2011-04-17 ENCOUNTER — Ambulatory Visit (INDEPENDENT_AMBULATORY_CARE_PROVIDER_SITE_OTHER): Payer: Medicaid Other | Admitting: *Deleted

## 2011-04-17 VITALS — BP 140/70

## 2011-04-17 DIAGNOSIS — O0990 Supervision of high risk pregnancy, unspecified, unspecified trimester: Secondary | ICD-10-CM

## 2011-04-17 DIAGNOSIS — O139 Gestational [pregnancy-induced] hypertension without significant proteinuria, unspecified trimester: Secondary | ICD-10-CM

## 2011-04-17 NOTE — Progress Notes (Signed)
P = 108    NST only today

## 2011-04-21 ENCOUNTER — Ambulatory Visit (INDEPENDENT_AMBULATORY_CARE_PROVIDER_SITE_OTHER): Payer: Medicaid Other | Admitting: Physician Assistant

## 2011-04-21 VITALS — BP 144/79 | Temp 97.8°F | Wt 230.0 lb

## 2011-04-21 DIAGNOSIS — O139 Gestational [pregnancy-induced] hypertension without significant proteinuria, unspecified trimester: Secondary | ICD-10-CM

## 2011-04-21 DIAGNOSIS — O0990 Supervision of high risk pregnancy, unspecified, unspecified trimester: Secondary | ICD-10-CM

## 2011-04-21 LAB — POCT URINALYSIS DIP (DEVICE)
Ketones, ur: NEGATIVE mg/dL
Leukocytes, UA: NEGATIVE
Nitrite: NEGATIVE
Protein, ur: NEGATIVE mg/dL
Urobilinogen, UA: 0.2 mg/dL (ref 0.0–1.0)
pH: 6 (ref 5.0–8.0)

## 2011-04-21 NOTE — Progress Notes (Signed)
P = 104  Pt reports occasional UC's and pain in lower abdomen.

## 2011-04-21 NOTE — Patient Instructions (Signed)

## 2011-04-21 NOTE — Progress Notes (Signed)
Reactive NST today. No complaints. Remains undecided about RLTCS vs TOLAC. Risk/benefits reviewed again. RTC Thursday for NST

## 2011-04-24 ENCOUNTER — Ambulatory Visit (INDEPENDENT_AMBULATORY_CARE_PROVIDER_SITE_OTHER): Payer: Medicaid Other | Admitting: *Deleted

## 2011-04-24 VITALS — BP 141/69

## 2011-04-24 DIAGNOSIS — O139 Gestational [pregnancy-induced] hypertension without significant proteinuria, unspecified trimester: Secondary | ICD-10-CM

## 2011-04-24 NOTE — Progress Notes (Signed)
NST reviewed and reactive.  

## 2011-04-24 NOTE — Progress Notes (Signed)
P = 108    NST only today 

## 2011-04-28 ENCOUNTER — Ambulatory Visit (INDEPENDENT_AMBULATORY_CARE_PROVIDER_SITE_OTHER): Payer: Medicaid Other | Admitting: Obstetrics & Gynecology

## 2011-04-28 ENCOUNTER — Other Ambulatory Visit: Payer: Self-pay | Admitting: Obstetrics & Gynecology

## 2011-04-28 VITALS — BP 126/82 | Temp 97.6°F | Wt 234.9 lb

## 2011-04-28 DIAGNOSIS — Z98891 History of uterine scar from previous surgery: Secondary | ICD-10-CM

## 2011-04-28 DIAGNOSIS — O139 Gestational [pregnancy-induced] hypertension without significant proteinuria, unspecified trimester: Secondary | ICD-10-CM

## 2011-04-28 DIAGNOSIS — Z9889 Other specified postprocedural states: Secondary | ICD-10-CM

## 2011-04-28 DIAGNOSIS — O368390 Maternal care for abnormalities of the fetal heart rate or rhythm, unspecified trimester, not applicable or unspecified: Secondary | ICD-10-CM

## 2011-04-28 LAB — POCT URINALYSIS DIP (DEVICE)
Protein, ur: NEGATIVE mg/dL
Specific Gravity, Urine: 1.025 (ref 1.005–1.030)
Urobilinogen, UA: 0.2 mg/dL (ref 0.0–1.0)

## 2011-04-28 LAB — GLUCOSE, CAPILLARY: Glucose-Capillary: 110 mg/dL — ABNORMAL HIGH (ref 70–99)

## 2011-04-28 NOTE — Progress Notes (Signed)
Swelling in ankles. °

## 2011-04-28 NOTE — Progress Notes (Signed)
04/17/11 NST reactive

## 2011-04-28 NOTE — Patient Instructions (Signed)
Cesarean Delivery °Care After °Refer to this sheet in the next few weeks. These instructions provide you with information on caring for yourself after your procedure. Your caregiver may also give you more specific instructions. Your treatment has been planned according to current medical practices, but problems sometimes occur. Call your caregiver if you have any problems or questions after your procedure. °HOME CARE INSTRUCTIONS °Healing will take time. You will have discomfort, tenderness, swelling, and bruising at the surgery site for a couple of weeks. This is normal and will get better as time goes on. °Activity °· Rest as much as possible the first 2 weeks.  °· When possible, have someone help you with your household activities and your baby for 2 to 3 weeks.  °· Limit your housework and social activity. Increase your activity gradually as your strength returns.  °· Do not climb stairs more than 2 to 3 times a day.  °· Do not lift anything heavier than your baby.  °· Follow your caregiver's instructions about driving a car.  °· Exercise only as directed by your caregiver.  °Nutrition °· You may return to your usual diet. Eat a well-balanced diet.  °· Drink enough fluid to keep your urine clear or pale yellow.  °· Keep taking your prenatal or multivitamins.  °· Do not drink alcohol until your caregiver says it is okay.  °Elimination °You should return to your usual bowel function. If you develop constipation, ask your caregiver about taking a mild laxative that will help you go to the bathroom. Bran foods and fluids help with constipation. Gradually add fruit, vegetables, and bran to your diet.  °Hygiene °· You may shower, wash your hair, and take tub baths unless your caregiver tells you otherwise.  °· Continue perineal care until your vaginal bleeding and discharge stops.  °· Do not douche or use tampons until your caregiver says it is okay.  °Fever °If you feel feverish or have shaking chills, take your  temperature. The fever may indicate infection. Infections can be treated with antibiotic medicine. °Pain Control and Medicine °· Only take over-the-counter or prescription medicine as directed by your caregiver. Do not take aspirin. It can cause bleeding.  °· Do not drive when taking pain medicine.  °· Talk to your caregiver about restarting or adjusting your normal medicines.  °Incision Care °· Clean your cut (incision) gently with soap and water, then pat dry.  °· If your caregiver says it is okay, leave the incision without a bandage (dressing) unless it is draining fluid or irritated.  °· If you have small adhesive strips across the incision and they do not fall off within 7 days, carefully peel them off.  °· Check the incision daily for increased redness, drainage, swelling, or separation of skin.  °· Hug a pillow when coughing or sneezing. This helps to relieve pain.  °Vaginal Care °You may have a vaginal discharge or bleeding for up to 6 weeks. If the vaginal discharge becomes bright red, bad smelling, heavy in amount, has blood clots, or if you have burning or frequent urination, call your caregiver. If your bleeding slows down and then gets heavier, your body is telling you to slow down and relax more. °Sexual Intercourse °· Check with your caregiver before resuming sexual activity. Often, after 4 to 6 weeks, if you feel good and are well rested, sexual activity may be resumed. Avoid positions that strain the incision site.  °· You can become pregnant before you have a period.   If you decide to have sexual intercourse, use birth control if you do not want to become pregnant right away.  °Health Practices °· Keep all your postpartum appointments as recommended by your caregiver. Generally, your caregiver will want to see you in 2 to 3 weeks.  °· Continue with your yearly pelvic exams.  °· Continue monthly self-breast exams and yearly physical exams with a Pap test.  °Breast Care °· If you are not  breastfeeding and your breasts become tender, hard, or leak milk, you may wear a tight-fitting bra and apply ice to your breasts.  °· If you are breastfeeding, wear a good support bra.  °· Call your caregiver if you have breast pain, flu-like symptoms, fever, or hardness and reddening of your breasts.  °Postpartum Blues °You may have a period of low spirits or "blues" after your baby is born. Discuss your feelings with your partner, family, and friends. This may be caused by the changing hormone levels in your body. You may want to contact your caregiver if this is worrisome. °Miscellaneous °· Limit wearing support panties or control-top hose.  °· If you breastfeed, you may not have a period for several months or longer. This is normal for the nursing mother. If you do not menstruate within 6 weeks after you stop breastfeeding, see your caregiver.  °· If you are not breastfeeding, you can expect to menstruate within 6 to 10 weeks after birth. If you have not started by the 11th week, check with your caregiver.  °SEEK MEDICAL CARE IF:  °· There is swelling, redness, or increasing pain in the wound area.  °· You have pus coming from the wound.  °· You notice a bad smell from the wound or surgical dressing.  °· You have pain, redness, and swelling from the intravenous (IV) site.  °· Your wound breaks open (the edges are not staying together).  °· You feel dizzy or feel like fainting.  °· You develop pain or bleeding when you urinate.  °· You develop diarrhea.  °· You develop nausea and vomiting.  °· You develop abnormal vaginal discharge.  °· You develop a rash.  °· You have any type of abnormal reaction or develop an allergy to your medicine.  °· Your pain is not relieved by your medicine or becomes worse.  °· Your temperature is 101° F (38.3° C), or is 100.4° F (38° C) taken 2 times in a 4 hour period.  °SEEK IMMEDIATE MEDICAL CARE: °· You develop a temperature of 102° F (38.9° C) or higher.  °· You develop abdominal  pain.  °· You develop chest pain.  °· You develop shortness of breath.  °· You faint.  °· You develop pain, swelling, or redness of your leg.  °· You develop heavy vaginal bleeding with or without blood clots.  °Document Released: 03/15/2002 Document Revised: 03/05/2011 Document Reviewed: 09/18/2010 °[employer]® Patient Information ©2012 [employer]. °

## 2011-04-28 NOTE — Progress Notes (Signed)
U/S scheduled 05/05/11 at 1015am.

## 2011-04-28 NOTE — Progress Notes (Signed)
NST reactive.  2 variables.  BPP10/10.  Pt decided on rpt c/s  Will schedule at 32 weeks.  Needs growth, so will schedule BPP and growth [place] for next Monday .  CBG = 110.  Last ate 2 1/2 hrs ago (milk and cookies).  Pt failed one hour GCT and passed 3 hr by one point.  Cont 2x week testing.

## 2011-05-01 ENCOUNTER — Ambulatory Visit (INDEPENDENT_AMBULATORY_CARE_PROVIDER_SITE_OTHER): Payer: Medicaid Other | Admitting: *Deleted

## 2011-05-01 VITALS — BP 131/83 | Wt 234.0 lb

## 2011-05-01 DIAGNOSIS — O139 Gestational [pregnancy-induced] hypertension without significant proteinuria, unspecified trimester: Secondary | ICD-10-CM

## 2011-05-01 NOTE — Progress Notes (Signed)
P = 99  NST only today

## 2011-05-05 ENCOUNTER — Other Ambulatory Visit: Payer: Medicaid Other

## 2011-05-05 ENCOUNTER — Ambulatory Visit (INDEPENDENT_AMBULATORY_CARE_PROVIDER_SITE_OTHER): Payer: Medicaid Other | Admitting: Family Medicine

## 2011-05-05 ENCOUNTER — Ambulatory Visit (HOSPITAL_COMMUNITY)
Admission: RE | Admit: 2011-05-05 | Discharge: 2011-05-05 | Disposition: A | Discharge status: Home / Self Care | Payer: Medicaid Other | Source: Ambulatory Visit | Attending: Obstetrics & Gynecology | Admitting: Obstetrics & Gynecology

## 2011-05-05 VITALS — BP 149/76 | Temp 97.7°F | Wt 234.9 lb

## 2011-05-05 DIAGNOSIS — Z9889 Other specified postprocedural states: Secondary | ICD-10-CM

## 2011-05-05 DIAGNOSIS — O34219 Maternal care for unspecified type scar from previous cesarean delivery: Secondary | ICD-10-CM | POA: Insufficient documentation

## 2011-05-05 DIAGNOSIS — O139 Gestational [pregnancy-induced] hypertension without significant proteinuria, unspecified trimester: Secondary | ICD-10-CM

## 2011-05-05 DIAGNOSIS — O10019 Pre-existing essential hypertension complicating pregnancy, unspecified trimester: Secondary | ICD-10-CM | POA: Insufficient documentation

## 2011-05-05 DIAGNOSIS — Z98891 History of uterine scar from previous surgery: Secondary | ICD-10-CM

## 2011-05-05 LAB — COMPREHENSIVE METABOLIC PANEL
Albumin: 3.5 g/dL (ref 3.5–5.2)
BUN: 4 mg/dL — ABNORMAL LOW (ref 6–23)
CO2: 22 mEq/L (ref 19–32)
Calcium: 9.5 mg/dL (ref 8.4–10.5)
Chloride: 105 mEq/L (ref 96–112)
Glucose, Bld: 114 mg/dL — ABNORMAL HIGH (ref 70–99)
Potassium: 4.1 mEq/L (ref 3.5–5.3)
Sodium: 136 mEq/L (ref 135–145)
Total Protein: 6.2 g/dL (ref 6.0–8.3)

## 2011-05-05 LAB — POCT URINALYSIS DIP (DEVICE)
Bilirubin Urine: NEGATIVE
Glucose, UA: NEGATIVE mg/dL
Ketones, ur: NEGATIVE mg/dL
Leukocytes, UA: NEGATIVE
pH: 7 (ref 5.0–8.0)

## 2011-05-05 LAB — CBC
HCT: 37.5 % (ref 36.0–46.0)
Hemoglobin: 12.5 g/dL (ref 12.0–15.0)
RBC: 4.66 MIL/uL (ref 3.87–5.11)
WBC: 5.9 10*3/uL (ref 4.0–10.5)

## 2011-05-05 NOTE — Patient Instructions (Signed)
Preeclampsia and Eclampsia Preeclampsia is a condition of high blood pressure during pregnancy. It can happen at 20 weeks or later in pregnancy. If high blood pressure occurs in the second half of pregnancy with no other symptoms, it is called gestational hypertension and goes away after the baby is born. If any of the symptoms listed below develop with gestational hypertension, it is then called preeclampsia. Eclampsia (convulsions) may follow preeclampsia. This is one of the reasons for regular prenatal checkups. Early diagnosis and treatment are very important to prevent eclampsia. CAUSES  There is no known cause of preeclampsia/eclampsia in pregnancy. There are several known conditions that may put the pregnant woman at risk, such as:  The first pregnancy.   Having preeclampsia in a past pregnancy.   Having lasting (chronic) high blood pressure.   Having multiples (twins, triplets).   Being age 38 or older.   African American ethnic background.   Having kidney disease or diabetes.   Medical conditions such as lupus or blood diseases.   Being overweight (obese).  SYMPTOMS   High blood pressure.   Headaches.   Sudden weight gain.   Swelling of hands, face, legs, and feet.   Protein in the urine.   Feeling sick to your stomach (nauseous) and throwing up (vomiting).   Vision problems (blurred or double vision).   Numbness in the face, arms, legs, and feet.   Dizziness.   Slurred speech.   Preeclampsia can cause growth retardation in the fetus.   Separation (abruption) of the placenta.   Not enough fluid in the amniotic sac (oligohydramnios).   Sensitivity to bright lights.   Belly (abdominal) pain.  DIAGNOSIS  If protein is found in the urine in the second half of pregnancy, this is considered preeclampsia. Other symptoms mentioned above may also be present. TREATMENT  It is necessary to treat this.  Your caregiver may prescribe bed rest early in this  condition. Plenty of rest and salt restriction may be all that is needed.   Medicines may be necessary to lower blood pressure if the condition does not respond to more conservative measures.   In more severe cases, hospitalization may be needed:   For treatment of blood pressure.   To control fluid retention.   To monitor the baby to see if the condition is causing harm to the baby.   Hospitalization is the best way to treat the first sign of preeclampsia. This is so the mother and baby can be watched closely and blood tests can be done effectively and correctly.   If the condition becomes severe, it may be necessary to induce labor or to remove the infant by surgical means (cesarean section). The best cure for preeclampsia/eclampsia is to deliver the baby.  Preeclampsia and eclampsia involve risks to mother and infant. Your caregiver will discuss these risks with you. Together, you can work out the best possible approach to your problems. Make sure you keep your prenatal visits as scheduled. Not keeping appointments could result in a chronic or permanent injury, pain, disability to you, and death or injury to you or your unborn baby. If there is any problem keeping the appointment, you must call to reschedule. HOME CARE INSTRUCTIONS   Keep your prenatal appointments and tests as scheduled.   Tell your caregiver if you have any of the above risk factors.   Get plenty of rest and sleep.   Eat a balanced diet that is low in salt, and do not add salt  to your food.   Avoid stressful situations.   Only take over-the-counter and prescriptions medicines for pain, discomfort, or fever as directed by your caregiver.  SEEK IMMEDIATE MEDICAL CARE IF:   You develop severe swelling anywhere in the body. This usually occurs in the legs.   You gain 5 lb/2.3 kg or more in a week.   You develop a severe headache, dizziness, problems with your vision, or confusion.   You have abdominal pain,  nausea, or vomiting.   You have a seizure.   You have trouble moving any part of your body, or you develop numbness or problems speaking.   You have bruising or abnormal bleeding from anywhere in the body.   You develop a stiff neck.   You pass out.  MAKE SURE YOU:   Understand these instructions.   Will watch your condition.   Will get help right away if you are not doing well or get worse.  Document Released: 06/20/2000 Document Revised: 03/05/2011 Document Reviewed: 02/04/2008 [facility] Patient Information 2012 [place], [place]. Pregnancy - Third Trimester The third trimester of pregnancy (the last 3 months) is a period of the most rapid growth for you and your baby. The baby approaches a length of 20 inches and a weight of 6 to 10 pounds. The baby is adding on fat and getting ready for life outside your body. While inside, babies have periods of sleeping and waking, suck their thumbs, and hiccups. You can often feel small contractions of the uterus. This is false labor. It is also called Braxton-Hicks contractions. This is like a practice for labor. The usual problems in this stage of pregnancy include more difficulty breathing, swelling of the hands and feet from water retention, and having to urinate more often because of the uterus and baby pressing on your bladder.  PRENATAL EXAMS  Blood work may continue to be done during prenatal exams. These tests are done to check on your health and the probable health of your baby. Blood work is used to follow your blood levels (hemoglobin). Anemia (low hemoglobin) is common during pregnancy. Iron and vitamins are given to help prevent this. You may also continue to be checked for diabetes. Some of the past blood tests may be done again.   The size of the uterus is measured during each visit. This makes sure your baby is growing properly according to your pregnancy dates.   Your blood pressure is checked every prenatal visit. This is to make  sure you are not getting toxemia.   Your urine is checked every prenatal visit for infection, diabetes and protein.   Your weight is checked at each visit. This is done to make sure gains are happening at the suggested rate and that you and your baby are growing normally.   Sometimes, an ultrasound is performed to confirm the position and the proper growth and development of the baby. This is a test done that bounces harmless sound waves off the baby so your caregiver can more accurately determine due dates.   Discuss the type of pain medication and anesthesia you will have during your labor and delivery.   Discuss the possibility and anesthesia if a Cesarean Section might be necessary.   Inform your caregiver if there is any mental or physical violence at home.  Sometimes, a specialized non-stress test, contraction stress test and biophysical profile are done to make sure the baby is not having a problem. Checking the amniotic fluid surrounding the baby is  called an amniocentesis. The amniotic fluid is removed by sticking a needle into the belly (abdomen). This is sometimes done near the end of pregnancy if an early delivery is required. In this case, it is done to help make sure the baby's lungs are mature enough for the baby to live outside of the womb. If the lungs are not mature and it is unsafe to deliver the baby, an injection of cortisone medication is given to the mother 1 to 2 days before the delivery. This helps the baby's lungs mature and makes it safer to deliver the baby. CHANGES OCCURING IN THE THIRD TRIMESTER OF PREGNANCY Your body goes through many changes during pregnancy. They vary from person to person. Talk to your caregiver about changes you notice and are concerned about.  During the last trimester, you have probably had an increase in your appetite. It is normal to have cravings for certain foods. This varies from person to person and pregnancy to pregnancy.   You may begin  to get stretch marks on your hips, abdomen, and breasts. These are normal changes in the body during pregnancy. There are no exercises or medications to take which prevent this change.   Constipation may be treated with a stool softener or adding bulk to your diet. Drinking lots of fluids, fiber in vegetables, fruits, and whole grains are helpful.   Exercising is also helpful. If you have been very active up until your pregnancy, most of these activities can be continued during your pregnancy. If you have been less active, it is helpful to start an exercise program such as walking. Consult your caregiver before starting exercise programs.   Avoid all smoking, alcohol, un-prescribed drugs, herbs and "street drugs" during your pregnancy. These chemicals affect the formation and growth of the baby. Avoid chemicals throughout the pregnancy to ensure the delivery of a healthy infant.   Backache, varicose veins and hemorrhoids may develop or get worse.   You will tire more easily in the third trimester, which is normal.   The baby's movements may be stronger and more often.   You may become short of breath easily.   Your belly button may stick out.   A yellow discharge may leak from your breasts called colostrum.   You may have a bloody mucus discharge. This usually occurs a few days to a week before labor begins.  HOME CARE INSTRUCTIONS   Keep your caregiver's appointments. Follow your caregiver's instructions regarding medication use, exercise, and diet.   During pregnancy, you are providing food for you and your baby. Continue to eat regular, well-balanced meals. Choose foods such as meat, fish, milk and other low fat dairy products, vegetables, fruits, and whole-grain breads and cereals. Your caregiver will tell you of the ideal weight gain.   A physical sexual relationship may be continued throughout pregnancy if there are no other problems such as early (premature) leaking of amniotic  fluid from the membranes, vaginal bleeding, or belly (abdominal) pain.   Exercise regularly if there are no restrictions. Check with your caregiver if you are unsure of the safety of your exercises. Greater weight gain will occur in the last 2 trimesters of pregnancy. Exercising helps:   Control your weight.   Get you in shape for labor and delivery.   You lose weight after you deliver.   Rest a lot with legs elevated, or as needed for leg cramps or low back pain.   Wear a good support or jogging bra  for breast tenderness during pregnancy. This may help if worn during sleep. Pads or tissues may be used in the bra if you are leaking colostrum.   Do not use hot tubs, steam rooms, or saunas.   Wear your seat belt when driving. This protects you and your baby if you are in an accident.   Avoid raw meat, cat litter boxes and soil used by cats. These carry germs that can cause birth defects in the baby.   It is easier to loose urine during pregnancy. Tightening up and strengthening the pelvic muscles will help with this problem. You can practice stopping your urination while you are going to the bathroom. These are the same muscles you need to strengthen. It is also the muscles you would use if you were trying to stop from passing gas. You can practice tightening these muscles up 10 times a set and repeating this about 3 times per day. Once you know what muscles to tighten up, do not perform these exercises during urination. It is more likely to cause an infection by backing up the urine.   Ask for help if you have financial, counseling or nutritional needs during pregnancy. Your caregiver will be able to offer counseling for these needs as well as refer you for other special needs.   Make a list of emergency phone numbers and have them available.   Plan on getting help from family or friends when you go home from the hospital.   Make a trial run to the hospital.   Take prenatal classes with  the father to understand, practice and ask questions about the labor and delivery.   Prepare the baby's room/nursery.   Do not travel out of the city unless it is absolutely necessary and with the advice of your caregiver.   Wear only low or no heal shoes to have better balance and prevent falling.  MEDICATIONS AND DRUG USE IN PREGNANCY  Take prenatal vitamins as directed. The vitamin should contain 1 milligram of folic acid. Keep all vitamins out of reach of children. Only a couple vitamins or tablets containing iron may be fatal to a baby or young child when ingested.   Avoid use of all medications, including herbs, over-the-counter medications, not prescribed or suggested by your caregiver. Only take over-the-counter or prescription medicines for pain, discomfort, or fever as directed by your caregiver. Do not use aspirin, ibuprofen (Motrin, Advil, Nuprin) or naproxen (Aleve) unless OK'd by your caregiver.   Let your caregiver also know about herbs you may be using.   Alcohol is related to a number of birth defects. This includes fetal alcohol syndrome. All alcohol, in any form, should be avoided completely. Smoking will cause low birth rate and premature babies.   Street/illegal drugs are very harmful to the baby. They are absolutely forbidden. A baby born to an addicted mother will be addicted at birth. The baby will go through the same withdrawal an adult does.  SEEK MEDICAL CARE IF: You have any concerns or worries during your pregnancy. It is better to call with your questions if you feel they cannot wait, rather than worry about them. DECISIONS ABOUT CIRCUMCISION You may or may not know the sex of your baby. If you know your baby is a boy, it may be time to think about circumcision. Circumcision is the removal of the foreskin of the penis. This is the skin that covers the sensitive end of the penis. There is no proven medical need  for this. Often this decision is made on what is  popular at the time or based upon religious beliefs and social issues. You can discuss these issues with your caregiver or pediatrician. SEEK IMMEDIATE MEDICAL CARE IF:   An unexplained oral temperature above 102 F (38.9 C) develops, or as your caregiver suggests.   You have leaking of fluid from the vagina (birth canal). If leaking membranes are suspected, take your temperature and tell your caregiver of this when you call.   There is vaginal spotting, bleeding or passing clots. Tell your caregiver of the amount and how many pads are used.   You develop a bad smelling vaginal discharge with a change in the color from clear to white.   You develop vomiting that lasts more than 24 hours.   You develop chills or fever.   You develop shortness of breath.   You develop burning on urination.   You loose more than 2 pounds of weight or gain more than 2 pounds of weight or as suggested by your caregiver.   You notice sudden swelling of your face, hands, and feet or legs.   You develop belly (abdominal) pain. Round ligament discomfort is a common non-cancerous (benign) cause of abdominal pain in pregnancy. Your caregiver still must evaluate you.   You develop a severe headache that does not go away.   You develop visual problems, blurred or double vision.   If you have not felt your baby move for more than 1 hour. If you think the baby is not moving as much as usual, eat something with sugar in it and lie down on your left side for an hour. The baby should move at least 4 to 5 times per hour. Call right away if your baby moves less than that.   You fall, are in a car accident or any kind of trauma.   There is mental or physical violence at home.  Document Released: 06/17/2001 Document Revised: 03/05/2011 Document Reviewed: 12/20/2008 [facility] Patient Information 2012 [place], [place].

## 2011-05-05 NOTE — Progress Notes (Signed)
Pt has [place] growth today @ 1030

## 2011-05-05 NOTE — Progress Notes (Signed)
NST reviewed and reactive. BP slightly up today, denies H/A, vision changes, RUQ pain.-Check labs.   U/S today at 10:30 for growth.

## 2011-05-05 NOTE — Progress Notes (Signed)
Pulse 112. Pelvic pressure.

## 2011-05-08 ENCOUNTER — Ambulatory Visit (INDEPENDENT_AMBULATORY_CARE_PROVIDER_SITE_OTHER): Payer: Medicaid Other | Admitting: *Deleted

## 2011-05-08 VITALS — BP 139/69 | Wt 234.0 lb

## 2011-05-08 DIAGNOSIS — O139 Gestational [pregnancy-induced] hypertension without significant proteinuria, unspecified trimester: Secondary | ICD-10-CM

## 2011-05-08 NOTE — Progress Notes (Signed)
P = 97  Pt brought 24 hr urine today- labs done on 10/29.

## 2011-05-09 LAB — CREATININE CLEARANCE, URINE, 24 HOUR
Creatinine Clearance: 163 mL/min — ABNORMAL HIGH (ref 75–115)
Creatinine, Urine: 47 mg/dL

## 2011-05-09 LAB — PROTEIN, URINE, 24 HOUR: Protein, 24H Urine: 150 mg/d — ABNORMAL HIGH (ref 50–100)

## 2011-05-12 ENCOUNTER — Ambulatory Visit (INDEPENDENT_AMBULATORY_CARE_PROVIDER_SITE_OTHER): Payer: Medicaid Other | Admitting: Obstetrics & Gynecology

## 2011-05-12 VITALS — BP 128/70 | Temp 97.7°F | Wt 236.1 lb

## 2011-05-12 DIAGNOSIS — O139 Gestational [pregnancy-induced] hypertension without significant proteinuria, unspecified trimester: Secondary | ICD-10-CM

## 2011-05-12 LAB — POCT URINALYSIS DIP (DEVICE)
Bilirubin Urine: NEGATIVE
Glucose, UA: NEGATIVE mg/dL
Hgb urine dipstick: NEGATIVE
Leukocytes, UA: NEGATIVE
Nitrite: NEGATIVE
Urobilinogen, UA: 0.2 mg/dL (ref 0.0–1.0)

## 2011-05-12 NOTE — Progress Notes (Signed)
P - 105 

## 2011-05-12 NOTE — Progress Notes (Signed)
Swelling in feet. 

## 2011-05-12 NOTE — Progress Notes (Signed)
NST 05/08/11 reviewed, reactive

## 2011-05-12 NOTE — Progress Notes (Signed)
BP nml today.  Pt has occasional headache.  Headaches are not any worse than in nonpregnant state.  C/o cough and rhinorrhea.  No fever.    Lungs-CTAB TM clear Nasal mucosa-infamed Throat--no exudate, slightly injected.  Continue 2x week testing.  NST reactive today. 24 hour urine was 150 mg protein and labs were WNL.

## 2011-05-15 ENCOUNTER — Ambulatory Visit (INDEPENDENT_AMBULATORY_CARE_PROVIDER_SITE_OTHER): Payer: Medicaid Other | Admitting: *Deleted

## 2011-05-15 VITALS — BP 136/68 | Wt 236.0 lb

## 2011-05-15 DIAGNOSIS — O139 Gestational [pregnancy-induced] hypertension without significant proteinuria, unspecified trimester: Secondary | ICD-10-CM

## 2011-05-15 NOTE — Progress Notes (Signed)
P-103 

## 2011-05-19 ENCOUNTER — Ambulatory Visit (INDEPENDENT_AMBULATORY_CARE_PROVIDER_SITE_OTHER): Payer: Medicaid Other | Admitting: Family Medicine

## 2011-05-19 DIAGNOSIS — O0990 Supervision of high risk pregnancy, unspecified, unspecified trimester: Secondary | ICD-10-CM

## 2011-05-19 DIAGNOSIS — Z98891 History of uterine scar from previous surgery: Secondary | ICD-10-CM

## 2011-05-19 DIAGNOSIS — Z23 Encounter for immunization: Secondary | ICD-10-CM

## 2011-05-19 DIAGNOSIS — O139 Gestational [pregnancy-induced] hypertension without significant proteinuria, unspecified trimester: Secondary | ICD-10-CM

## 2011-05-19 LAB — STREP B DNA PROBE: GBS: NEGATIVE

## 2011-05-19 LAB — POCT URINALYSIS DIP (DEVICE)
Ketones, ur: NEGATIVE mg/dL
Leukocytes, UA: NEGATIVE
Nitrite: NEGATIVE
Protein, ur: 30 mg/dL — AB
Urobilinogen, UA: 0.2 mg/dL (ref 0.0–1.0)
pH: 5.5 (ref 5.0–8.0)

## 2011-05-19 MED ORDER — TETANUS-DIPHTH-ACELL PERTUSSIS 5-2.5-18.5 LF-MCG/0.5 IM SUSP: 0.5000 mL | Freq: Once | INTRAMUSCULAR | Status: DC

## 2011-05-19 NOTE — Progress Notes (Signed)
Last u/s 10/29--63%.  Nml fluid.  Cultures and Tdap today.  2x/wk testing.

## 2011-05-19 NOTE — Patient Instructions (Addendum)
Cesarean Delivery  Cesarean delivery is the birth of a baby through a cut (incision) in the abdomen and womb (uterus).  LET YOUR CAREGIVER KNOW ABOUT:  Complicationsinvolving the pregnancy.   Allergies.   Medicines taken including herbs, eyedrops, over-the-counter medicines, and creams.   Use of steroids (by mouth or creams).   Previous problems with anesthetics or numbing medicine.   Previous surgery.   History of blood clots.   History of bleeding or blood problems.   Other health problems.  RISKS AND COMPLICATIONS   Bleeding.   Infection.   Blood clots.   Injury to surrounding organs.   Anesthesia problems.   Injury to the baby.  BEFORE THE PROCEDURE   A tube (Foley catheter) will be placed in your bladder. The Foley catheter drains the urine from your bladder into a bag. This keeps your bladder empty during surgery.   An intravenous access tube (IV) will be placed in your arm.   Hair may be removed from your pubic area and your lower abdomen. This is to prevent infection in the incision site.   You may be given an antacid medicine to drink. This will prevent acid contents in your stomach from going into your lungs if you vomit during the surgery.   You may be given an antibiotic medicine to prevent infection.  PROCEDURE   You may be given medicine to numb the lower half of your body (regional anesthetic). If you were in labor, you may have already had an epidural in place which can be used in both labor and cesarean delivery. You may possibly be given medicine to make you sleep (general anesthetic) though this is not as common.   An incision will be made in your abdomen that extends to your uterus. There are 2 basic kinds of incisions:   The horizontal (transverse) incision. Horizontal incisions are used for most routine cesarean deliveries.   The vertical (up and down) incision. This is less commonly used. This is most often reserved for women who have a  serious complication (extreme prematurity) or under emergency situations.   The horizontal and vertical incisions may both be used at the same time. However, this is very uncommon.   Your baby will then be delivered.  AFTER THE PROCEDURE   If you were awake during the surgery, you will see your baby right away. If you were asleep, you will see your baby as soon as you are awake.   You may breastfeed your baby after surgery.   You may be able to get up and walk the same day as the surgery. If you need to stay in bed for a period of time, you will receive help to turn, cough, and take deep breaths after surgery. This helps prevent lung problems such as pneumonia.   Do not get out of bed alone the first time after surgery. You will need help getting out of bed until you are able to do this by yourself.   You may be able to shower the day after your cesarean delivery. After the bandage (dressing) is taken off the incision site, a nurse will assist you to shower, if you like.   You will have pneumatic compressing hose placed on your feet or lower legs. These hose are used to prevent blood clots. When you are up and walking regularly, they will no longer be necessary.   Do not cross your legs when you sit.   Save any blood clots that you  pass. If you pass a clot while on the toilet, do not flush it. Call for the nurse. Tell the nurse if you think you are bleeding too much or passing too many clots.   Start drinking liquids and eating food as directed by your caregiver. If your stomach is not ready, drinking and eating too soon can cause an increase in bloating and swelling of your intestine and abdomen. This is very uncomfortable.   You will be given medicine as needed. Let your caregivers know if you are hurting. They want you to be comfortable. You may also be given an antibiotic to prevent an infection.   Your IV will be taken out when you are drinking a reasonable amount of fluids. The  Foley catheter is taken out when you are up and walking.   If your blood type is Rh negative and your baby's blood type is Rh positive, you will be given a shot of anti-D immune globulin. This shot prevents you from having Rh problems with a future pregnancy. You should get the shot even if you had your tubes tied (tubal ligation).   If you are allowed to take the baby for a walk, place the baby in the bassinet and push it. Do not carry your baby in your arms.  Document Released: 06/23/2005 Document Revised: 03/05/2011 Document Reviewed: 10/18/2010 [facility] Patient Information 2012 [place], [place]. Birth Control Choices Birth control is the use of any practices, methods, or devices to prevent pregnancy from happening in a sexually active woman.  Below are some birth control choices to help avoid pregnancy.  Not having sex (abstinence) is the surest form of birth control. This requires self-control. There is no risk of acquiring a sexually transmitted disease (STD), including acquired immunodeficiency syndrome (AIDS).   Periodic abstinence requires self-control during certain times of the month.   Calendar method, timing your menstrual periods from month to month.   Ovulation method is avoiding sexual intercourse around the time you produce an egg (ovulate).   Symptotherm method is avoiding sexual intercourse at the time of ovulation, using a thermometer and ovulation symptoms.   Post ovulation method is the timing of sexual intercourse after you ovulated.  These methods do not protect against STDs, including AIDS.  Birth control pills (BCPs) contain estrogen and progesterone hormone. These medicines work by stopping the egg from forming in the ovary (ovulation). Birth control pills are prescribed by a caregiver who will ask you questions about the risks of taking BCPs. Birth control pills do not protect against STDs, including AIDS.   "Minipill" birth control pills have only the  progesterone hormone. They are taken every day of each month and must be prescribed by your caregiver. They do not protect against STDs, including AIDS.   Emergency contraception is often call the "morning after" pill. This pill can be taken right after sex or up to five days after sex if you think your birth control failed, you failed to use contraception, or you were forced to have sex. It is most effective the sooner you take the pills after having sexual intercourse. Do not use emergency contraception as your only form of birth control. Emergency contraceptive pills are available without a prescription. Check with your pharmacist.   Condoms are a thin sheath of latex, synthetic material, or lambskin worn over the penis during sexual intercourse. They can have a spermicide in or on them when you buy them. Latex condoms can prevent pregnancy and STDs. "Natural" or lambskin  condoms can prevent pregnancy but may not protect against STDs, including AIDS.   Female condoms are a soft, loose-fitting sheath that is put into the vagina before sexual intercourse. They can prevent pregnancy and STDs, including AIDS.   Sponge is a soft, circular piece of polyurethane foam with spermicide in it that is inserted into the vagina after wetting it and before sexual intercourse. It does not require a prescription from your caregiver. It does not protect against STDs, including AIDS.   Diaphragm is a soft, latex, dome-shaped barrier that must be fitted by a caregiver. It is inserted into the vagina, along with a spermicidal jelly. After the proper fitting for a diaphragm, always insert the diaphragm before intercourse. The diaphragm should be left in the vagina for 6 to 8 hours after intercourse. Removal and reinsertion with a spermicide is always necessary after any use. It does not protect against STDs, including AIDS.   Progesterone-only injections are given every 3 months to prevent pregnancy. These injections  contain synthetic progesterone and no estrogen. This hormone stops the ovaries from releasing eggs. It also causes the cervical mucus to thicken and changes the uterine lining. This makes it harder for sperm to survive in the uterus. It does not protect against STDs, including AIDS.   Birth Control Patch contains hormones similar to those in birth control pills, so effectiveness, risks, and side effects are similar. It must be changed once a week and is prescribed by a caregiver. It is less effective in very overweight women. It does not protect against STDs, including AIDS.   Vaginal Ring contains hormones similar to those in birth control pills. It is left in place for 3 weeks, removed for 1 week, and then a new one is put back into the vagina. It comes with a timer to put in your purse to help you remember when to take it out or put a new one in. A caregiver's examination and prescription is necessary, just like with birth control pills and the patch. It does not protect against STDs, including AIDS.   Estrogen plus progesterone injections are given every 28 to 30 days. They can be given in the upper arm, thigh, or buttocks. It does not protect against STDs, including AIDS.   Intrauterine device (IUD): copper T or progestin filled is a T-shaped device that is put in a woman's uterus during a menstrual period to prevent pregnancy. The copper T IUD can last 10 years, and the progestin IUD can last 5 years. The progestin IUD can also help control heavy menstrual periods. It does not protect against STDs, including AIDS. The copper T IUD can be used as emergency contraception if inserted within 5 days of having unprotected intercourse.   Cervical cap is a round, soft latex or plastic cup that fits over the cervix and must be fitted by a caregiver. You do not need to use a spermicide with it or remove and insert it every time you have sexual intercourse. It does not protect against STDs, including AIDS.    Spermicides are chemicals that kill or block sperm from entering the cervix and uterus. They come in the form of creams, jellies, suppositories, foam, or tablets, and they do not require a prescription. They are inserted into the vagina with an applicator before having sexual intercourse. This must be repeated every time you have sexual intercourse.   Withdrawal is using the method of the female withdrawing his penis from sexual intercourse before he has a  climax and deposits his sperm. It does not protect against STDs, including AIDS.   Female tubal ligation is when the woman's fallopian tubes are surgically sealed or tied to prevent the egg from traveling to the uterus. It does not protect against STDs, including AIDS.   Female sterilization is when the female has his tubes that carry sperm tied off (vasectomy) to stop sperm from entering the vagina during sexual intercourse. It does not protect against STDs, including AIDS.  Regardless of which method of birth control you choose, it is still important that you use some form of protection against STDs. Document Released: 06/23/2005 Document Revised: 07/26/2010 Document Reviewed: 05/10/2009 [facility] Patient Information 2012 [place], [place]. Breastfeeding BENEFITS OF BREASTFEEDING For the baby  The first milk (colostrum) helps the baby's digestive system function better.   There are antibodies from the mother in the milk that help the baby fight off infections.   The baby has a lower incidence of asthma, allergies, and SIDS (sudden infant death syndrome).   The nutrients in breast milk are better than formulas for the baby and helps the baby's brain grow better.   Babies who breastfeed have less gas, colic, and constipation.  For the mother  Breastfeeding helps develop a very special bond between mother and baby.   It is more convenient, always available at the correct temperature and cheaper than formula feeding.   It burns calories in  the mother and helps with losing weight that was gained during pregnancy.   It makes the uterus contract back down to normal size faster and slows bleeding following delivery.   Breastfeeding mothers have a lower risk of developing breast cancer.  NURSE FREQUENTLY  A healthy, full-term baby may breastfeed as often as every hour or space his or her feedings to every 3 hours.   How often to nurse will vary from baby to baby. Watch your baby for signs of hunger, not the clock.   Nurse as often as the baby requests, or when you feel the need to reduce the fullness of your breasts.   Awaken the baby if it has been 3 to 4 hours since the last feeding.   Frequent feeding will help the mother make more milk and will prevent problems like sore nipples and engorgement of the breasts.  BABY'S POSITION AT THE BREAST  Whether lying down or sitting, be sure that the baby's tummy is facing your tummy.   Support the breast with 4 fingers underneath the breast and the thumb above. Make sure your fingers are well away from the nipple and baby's mouth.   Stroke the baby's lips and cheek closest to the breast gently with your finger or nipple.   When the baby's mouth is open wide enough, place all of your nipple and as much of the dark area around the nipple as possible into your baby's mouth.   Pull the baby in close so the tip of the nose and the baby's cheeks touch the breast during the feeding.  FEEDINGS  The length of each feeding varies from baby to baby and from feeding to feeding.   The baby must suck about 2 to 3 minutes for your milk to get to him or her. This is called a "let down." For this reason, allow the baby to feed on each breast as long as he or she wants. Your baby will end the feeding when he or she has received the right balance of nutrients.   To  break the suction, put your finger into the corner of the baby's mouth and slide it between his or her gums before removing your breast  from his or her mouth. This will help prevent sore nipples.  REDUCING BREAST ENGORGEMENT  In the first week after your baby is born, you may experience signs of breast engorgement. When breasts are engorged, they feel heavy, warm, full, and may be tender to the touch. You can reduce engorgement if you:   Nurse frequently, every 2 to 3 hours. Mothers who breastfeed early and often have fewer problems with engorgement.   Place light ice packs on your breasts between feedings. This reduces swelling. Wrap the ice packs in a lightweight towel to protect your skin.   Apply moist hot packs to your breast for 5 to 10 minutes before each feeding. This increases circulation and helps the milk flow.   Gently massage your breast before and during the feeding.   Make sure that the baby empties at least one breast at every feeding before switching sides.   Use a breast pump to empty the breasts if your baby is sleepy or not nursing well. You may also want to pump if you are returning to work or or you feel you are getting engorged.   Avoid bottle feeds, pacifiers or supplemental feedings of water or juice in place of breastfeeding.   Be sure the baby is latched on and positioned properly while breastfeeding.   Prevent fatigue, stress, and anemia.   Wear a supportive bra, avoiding underwire styles.   Eat a balanced diet with enough fluids.  If you follow these suggestions, your engorgement should improve in 24 to 48 hours. If you are still experiencing difficulty, call your lactation consultant or caregiver. IS MY BABY GETTING ENOUGH MILK? Sometimes, mothers worry about whether their babies are getting enough milk. You can be assured that your baby is getting enough milk if:  The baby is actively sucking and you hear swallowing.   The baby nurses at least 8 to 12 times in a 24 hour time period. Nurse your baby until he or she unlatches or falls asleep at the first breast (at least 10 to 20 minutes),  then offer the second side.   The baby is wetting 5 to 6 disposable diapers (6 to 8 cloth diapers) in a 24 hour period by 58 to 41 days of age.   The baby is having at least 2 to 3 stools every 24 hours for the first few months. Breast milk is all the food your baby needs. It is not necessary for your baby to have water or formula. In fact, to help your breasts make more milk, it is best not to give your baby supplemental feedings during the early weeks.   The stool should be soft and yellow.   The baby should gain 4 to 7 ounces per week after he is 38 days old.  TAKE CARE OF YOURSELF Take care of your breasts by:  Bathing or showering daily.   Avoiding the use of soaps on your nipples.   Start feedings on your left breast at one feeding and on your right breast at the next feeding.   You will notice an increase in your milk supply 2 to 5 days after delivery. You may feel some discomfort from engorgement, which makes your breasts very firm and often tender. Engorgement "peaks" out within 24 to 48 hours. In the meantime, apply warm moist towels to your  breasts for 5 to 10 minutes before feeding. Gentle massage and expression of some milk before feeding will soften your breasts, making it easier for your baby to latch on. Wear a well fitting nursing bra and air dry your nipples for 10 to 15 minutes after each feeding.   Only use cotton bra pads.   Only use pure lanolin on your nipples after nursing. You do not need to wash it off before nursing.  Take care of yourself by:   Eating well-balanced meals and nutritious snacks.   Drinking milk, fruit juice, and water to satisfy your thirst (about 8 glasses a day).   Getting plenty of rest.   Increasing calcium in your diet (1200 mg a day).   Avoiding foods that you notice affect the baby in a bad way.  SEEK MEDICAL CARE IF:   You have any questions or difficulty with breastfeeding.   You need help.   You have a hard, red, sore area on  your breast, accompanied by a fever of 100.5 F (38.1 C) or more.   Your baby is too sleepy to eat well or is having trouble sleeping.   Your baby is wetting less than 6 diapers per day, by 93 days of age.   Your baby's skin or white part of his or her eyes is more yellow than it was in the hospital.   You feel depressed.  Document Released: 06/23/2005 Document Revised: 03/05/2011 Document Reviewed: 02/05/2009 [facility] Patient Information 2012 [place], [place].

## 2011-05-19 NOTE — Progress Notes (Signed)
Pulse- 127   Edema-feet. Pain-when baby is moving, Pressure-lower abd.   Pt need GBS and GC/Chlamydia

## 2011-05-20 LAB — GC/CHLAMYDIA PROBE AMP, GENITAL
Chlamydia, DNA Probe: NEGATIVE
GC Probe Amp, Genital: NEGATIVE

## 2011-05-21 MED ORDER — OXYTOCIN 20 UNITS IN LACTATED RINGERS INFUSION - SIMPLE
INTRAVENOUS | Status: AC
  Filled 2011-05-21: qty 1000

## 2011-05-21 NOTE — Patient Instructions (Signed)
   Your procedure is scheduled ZO:[id number] November 24th  Enter through the Main Entrance of [facility] at:9:30am Pick up the phone at the desk and dial [phone] and inform us of your arrival.  Please call this number if you have any problems the morning of surgery: [phone]  Remember: Do not eat food after midnight:Friday Do not drink clear liquids after:midnight Friday Take these medicines the morning of surgery with a SIP OF WATER:  Do not wear jewelry, make-up, or FINGER nail polish Do not wear lotions, powders, or perfumes.  You may not  wear deodorant. Do not shave 48 hours prior to surgery. Do not bring valuables to the hospital.  Leave suitcase in the car. After Surgery it may be brought to your room. For patients being admitted to the hospital, checkout time is 11:00am the day of discharge.      Remember to use your hibiclens as instructed.Please shower with 1/2 bottle the evening before your surgery and the other 1/2 bottle the morning of surgery.

## 2011-05-22 ENCOUNTER — Ambulatory Visit (INDEPENDENT_AMBULATORY_CARE_PROVIDER_SITE_OTHER): Payer: Medicaid Other | Admitting: *Deleted

## 2011-05-22 ENCOUNTER — Encounter (HOSPITAL_COMMUNITY): Payer: Self-pay | Admitting: Pharmacy Technician

## 2011-05-22 VITALS — BP 127/77 | Wt 236.0 lb

## 2011-05-22 DIAGNOSIS — O139 Gestational [pregnancy-induced] hypertension without significant proteinuria, unspecified trimester: Secondary | ICD-10-CM

## 2011-05-22 LAB — CULTURE, BETA STREP (GROUP B ONLY)

## 2011-05-22 NOTE — Progress Notes (Signed)
P = 102  NST only today

## 2011-05-25 ENCOUNTER — Inpatient Hospital Stay (HOSPITAL_COMMUNITY)
Admission: AD | Admit: 2011-05-25 | Discharge: 2011-05-28 | DRG: 765 | Disposition: A | Discharge status: Home / Self Care | Payer: Medicaid Other | Source: Ambulatory Visit | Attending: Obstetrics & Gynecology | Admitting: Obstetrics & Gynecology

## 2011-05-25 ENCOUNTER — Encounter (HOSPITAL_COMMUNITY): Payer: Self-pay | Admitting: *Deleted

## 2011-05-25 ENCOUNTER — Encounter (HOSPITAL_COMMUNITY)
Admission: AD | Disposition: A | Discharge status: Home / Self Care | Payer: Self-pay | Source: Ambulatory Visit | Attending: Obstetrics & Gynecology

## 2011-05-25 ENCOUNTER — Encounter (HOSPITAL_COMMUNITY): Payer: Self-pay | Admitting: Anesthesiology

## 2011-05-25 ENCOUNTER — Inpatient Hospital Stay (HOSPITAL_COMMUNITY): Payer: Medicaid Other | Admitting: Anesthesiology

## 2011-05-25 ENCOUNTER — Other Ambulatory Visit: Payer: Self-pay | Admitting: Obstetrics & Gynecology

## 2011-05-25 ENCOUNTER — Inpatient Hospital Stay (HOSPITAL_COMMUNITY): Payer: Medicaid Other

## 2011-05-25 DIAGNOSIS — O34219 Maternal care for unspecified type scar from previous cesarean delivery: Secondary | ICD-10-CM

## 2011-05-25 DIAGNOSIS — O4590 Premature separation of placenta, unspecified, unspecified trimester: Secondary | ICD-10-CM

## 2011-05-25 DIAGNOSIS — O9903 Anemia complicating the puerperium: Secondary | ICD-10-CM

## 2011-05-25 DIAGNOSIS — D649 Anemia, unspecified: Secondary | ICD-10-CM | POA: Diagnosis not present

## 2011-05-25 DIAGNOSIS — O139 Gestational [pregnancy-induced] hypertension without significant proteinuria, unspecified trimester: Secondary | ICD-10-CM

## 2011-05-25 DIAGNOSIS — O1002 Pre-existing essential hypertension complicating childbirth: Secondary | ICD-10-CM | POA: Diagnosis present

## 2011-05-25 DIAGNOSIS — O1410 Severe pre-eclampsia, unspecified trimester: Secondary | ICD-10-CM | POA: Diagnosis present

## 2011-05-25 LAB — CBC
HCT: 36.7 % (ref 36.0–46.0)
Hemoglobin: 12.5 g/dL (ref 12.0–15.0)
MCH: 27.4 pg (ref 26.0–34.0)
RBC: 4.56 MIL/uL (ref 3.87–5.11)

## 2011-05-25 LAB — COMPREHENSIVE METABOLIC PANEL
ALT: 12 U/L (ref 0–35)
AST: 36 U/L (ref 0–37)
Albumin: 3 g/dL — ABNORMAL LOW (ref 3.5–5.2)
CO2: 20 mEq/L (ref 19–32)
Calcium: 10.6 mg/dL — ABNORMAL HIGH (ref 8.4–10.5)
Creatinine, Ser: 0.57 mg/dL (ref 0.50–1.10)
GFR calc non Af Amer: >90 mL/min (ref >90)
Sodium: 133 mEq/L — ABNORMAL LOW (ref 135–145)
Total Protein: 6.8 g/dL (ref 6.0–8.3)

## 2011-05-25 SURGERY — Surgical Case
Anesthesia: Regional | Site: Abdomen | Wound class: Clean Contaminated

## 2011-05-25 MED ORDER — METOCLOPRAMIDE HCL 5 MG/ML IJ SOLN: 10.0000 mg | Freq: Three times a day (TID) | INTRAMUSCULAR | Status: DC | PRN

## 2011-05-25 MED ORDER — OXYTOCIN 10 UNIT/ML IJ SOLN
INTRAMUSCULAR | Status: AC
  Filled 2011-05-25: qty 2

## 2011-05-25 MED ORDER — ONDANSETRON HCL 4 MG/2ML IJ SOLN: 4.0000 mg | Freq: Three times a day (TID) | INTRAMUSCULAR | Status: DC | PRN

## 2011-05-25 MED ORDER — EPHEDRINE SULFATE 50 MG/ML IJ SOLN
INTRAMUSCULAR | Status: DC | PRN
  Administered 2011-05-25 (×2): 10 mg via INTRAVENOUS

## 2011-05-25 MED ORDER — LACTATED RINGERS IV SOLN
INTRAVENOUS | Status: DC
  Administered 2011-05-25 – 2011-05-26 (×2): via INTRAVENOUS

## 2011-05-25 MED ORDER — MAGNESIUM SULFATE 40 G IN LACTATED RINGERS - SIMPLE
2.0000 g/h | INTRAVENOUS | Status: AC
  Administered 2011-05-25: 2 g/h via INTRAVENOUS
  Filled 2011-05-25: qty 500

## 2011-05-25 MED ORDER — FAMOTIDINE IN NACL 20-0.9 MG/50ML-% IV SOLN
INTRAVENOUS | Status: AC
  Administered 2011-05-25: 20 mg via INTRAVENOUS
  Filled 2011-05-25: qty 50

## 2011-05-25 MED ORDER — ACETAMINOPHEN 325 MG PO TABS: 325.0000 mg | ORAL_TABLET | ORAL | Status: DC | PRN

## 2011-05-25 MED ORDER — FENTANYL CITRATE 0.05 MG/ML IJ SOLN
INTRAMUSCULAR | Status: AC
  Filled 2011-05-25: qty 2

## 2011-05-25 MED ORDER — SODIUM CHLORIDE 0.9 % IR SOLN
Status: DC | PRN
  Administered 2011-05-25: 1000 mL

## 2011-05-25 MED ORDER — ACETAMINOPHEN 10 MG/ML IV SOLN: 1000.0000 mg | Freq: Four times a day (QID) | INTRAVENOUS | Status: AC | PRN

## 2011-05-25 MED ORDER — EPHEDRINE 5 MG/ML INJ
INTRAVENOUS | Status: AC
Start: 2011-05-25 — End: 2011-05-25
  Filled 2011-05-25: qty 10

## 2011-05-25 MED ORDER — IBUPROFEN 600 MG PO TABS: 600.0000 mg | ORAL_TABLET | Freq: Four times a day (QID) | ORAL | Status: DC | PRN

## 2011-05-25 MED ORDER — DIPHENHYDRAMINE HCL 50 MG/ML IJ SOLN: 25.0000 mg | INTRAMUSCULAR | Status: DC | PRN

## 2011-05-25 MED ORDER — MORPHINE SULFATE (PF) 0.5 MG/ML IJ SOLN
INTRAMUSCULAR | Status: DC | PRN
  Administered 2011-05-25: .1 mg via INTRATHECAL

## 2011-05-25 MED ORDER — SODIUM CHLORIDE 0.9 % IV SOLN: 1.0000 ug/kg/h | INTRAVENOUS | Status: DC | PRN

## 2011-05-25 MED ORDER — DIPHENHYDRAMINE HCL 50 MG/ML IJ SOLN: 12.5000 mg | INTRAMUSCULAR | Status: DC | PRN

## 2011-05-25 MED ORDER — CITRIC ACID-SODIUM CITRATE 334-500 MG/5ML PO SOLN
30.0000 mL | Freq: Once | ORAL | Status: AC
  Administered 2011-05-25: 30 mL via ORAL

## 2011-05-25 MED ORDER — NALBUPHINE HCL 10 MG/ML IJ SOLN: 5.0000 mg | INTRAMUSCULAR | Status: DC | PRN

## 2011-05-25 MED ORDER — KETOROLAC TROMETHAMINE 30 MG/ML IJ SOLN
INTRAMUSCULAR | Status: AC
  Administered 2011-05-25: 30 mg via INTRAVENOUS
  Filled 2011-05-25: qty 1

## 2011-05-25 MED ORDER — PROMETHAZINE HCL 25 MG/ML IJ SOLN: 6.2500 mg | INTRAMUSCULAR | Status: DC | PRN

## 2011-05-25 MED ORDER — KETOROLAC TROMETHAMINE 30 MG/ML IJ SOLN: 30.0000 mg | Freq: Four times a day (QID) | INTRAMUSCULAR | Status: AC | PRN

## 2011-05-25 MED ORDER — ONDANSETRON HCL 4 MG/2ML IJ SOLN
INTRAMUSCULAR | Status: AC
  Filled 2011-05-25: qty 2

## 2011-05-25 MED ORDER — ONDANSETRON HCL 4 MG/2ML IJ SOLN
INTRAMUSCULAR | Status: DC | PRN
  Administered 2011-05-25: 4 mg via INTRAVENOUS

## 2011-05-25 MED ORDER — BUPIVACAINE IN DEXTROSE 0.75-8.25 % IT SOLN
INTRATHECAL | Status: DC | PRN
  Administered 2011-05-25: 1.5 mL via INTRATHECAL

## 2011-05-25 MED ORDER — MORPHINE SULFATE (PF) 0.5 MG/ML IJ SOLN
INTRAMUSCULAR | Status: DC | PRN
  Administered 2011-05-25: 1 mg via INTRAVENOUS
  Administered 2011-05-25: 1 mg via EPIDURAL
  Administered 2011-05-25: .9 mg via INTRAVENOUS
  Administered 2011-05-25 (×2): 1 mg via INTRAVENOUS

## 2011-05-25 MED ORDER — LACTATED RINGERS IV SOLN: INTRAVENOUS | Status: DC

## 2011-05-25 MED ORDER — FENTANYL CITRATE 0.05 MG/ML IJ SOLN
INTRAMUSCULAR | Status: DC | PRN
  Administered 2011-05-25: 50 ug via INTRAVENOUS
  Administered 2011-05-25: 25 ug via INTRAVENOUS

## 2011-05-25 MED ORDER — KETOROLAC TROMETHAMINE 30 MG/ML IJ SOLN
30.0000 mg | Freq: Four times a day (QID) | INTRAMUSCULAR | Status: AC | PRN
  Administered 2011-05-25: 30 mg via INTRAVENOUS

## 2011-05-25 MED ORDER — SODIUM CHLORIDE 0.9 % IJ SOLN
3.0000 mL | INTRAMUSCULAR | Status: DC | PRN
  Administered 2011-05-27: 3 mL via INTRAVENOUS

## 2011-05-25 MED ORDER — MEPERIDINE HCL 25 MG/ML IJ SOLN: 6.2500 mg | INTRAMUSCULAR | Status: DC | PRN

## 2011-05-25 MED ORDER — PHENYLEPHRINE 40 MCG/ML (10ML) SYRINGE FOR IV PUSH (FOR BLOOD PRESSURE SUPPORT)
PREFILLED_SYRINGE | INTRAVENOUS | Status: AC
  Filled 2011-05-25: qty 10

## 2011-05-25 MED ORDER — MORPHINE SULFATE 0.5 MG/ML IJ SOLN
INTRAMUSCULAR | Status: AC
  Filled 2011-05-25: qty 10

## 2011-05-25 MED ORDER — FAMOTIDINE IN NACL 20-0.9 MG/50ML-% IV SOLN
20.0000 mg | Freq: Once | INTRAVENOUS | Status: AC
  Administered 2011-05-25: 20 mg via INTRAVENOUS

## 2011-05-25 MED ORDER — FENTANYL CITRATE 0.05 MG/ML IJ SOLN: 25.0000 ug | INTRAMUSCULAR | Status: DC | PRN

## 2011-05-25 MED ORDER — PHENYLEPHRINE HCL 10 MG/ML IJ SOLN
INTRAMUSCULAR | Status: DC | PRN
  Administered 2011-05-25: 80 ug via INTRAVENOUS
  Administered 2011-05-25: 120 ug via INTRAVENOUS
  Administered 2011-05-25: 40 ug via INTRAVENOUS
  Administered 2011-05-25: 80 ug via INTRAVENOUS
  Administered 2011-05-25: 120 ug via INTRAVENOUS
  Administered 2011-05-25 (×2): 80 ug via INTRAVENOUS

## 2011-05-25 MED ORDER — PHENYLEPHRINE 40 MCG/ML (10ML) SYRINGE FOR IV PUSH (FOR BLOOD PRESSURE SUPPORT)
PREFILLED_SYRINGE | INTRAVENOUS | Status: AC
  Filled 2011-05-25: qty 5

## 2011-05-25 MED ORDER — CEFAZOLIN SODIUM 1-5 GM-% IV SOLN
INTRAVENOUS | Status: AC
  Administered 2011-05-25: 1000 mg
  Filled 2011-05-25: qty 50

## 2011-05-25 MED ORDER — DIPHENHYDRAMINE HCL 25 MG PO CAPS: 25.0000 mg | ORAL_CAPSULE | ORAL | Status: DC | PRN

## 2011-05-25 MED ORDER — FENTANYL CITRATE 0.05 MG/ML IJ SOLN
INTRAMUSCULAR | Status: DC | PRN
  Administered 2011-05-25: 25 ug via INTRATHECAL

## 2011-05-25 MED ORDER — OXYTOCIN 10 UNIT/ML IJ SOLN
40.0000 [IU] | INTRAVENOUS | Status: DC | PRN
  Administered 2011-05-25: 40 [IU] via INTRAVENOUS

## 2011-05-25 MED ORDER — SCOPOLAMINE 1 MG/3DAYS TD PT72
MEDICATED_PATCH | TRANSDERMAL | Status: AC
  Administered 2011-05-25: 1.5 mg via TRANSDERMAL
  Filled 2011-05-25: qty 1

## 2011-05-25 MED ORDER — LACTATED RINGERS IV SOLN
INTRAVENOUS | Status: DC | PRN
  Administered 2011-05-25 (×3): via INTRAVENOUS

## 2011-05-25 MED ORDER — MAGNESIUM SULFATE BOLUS VIA INFUSION
4.0000 g | Freq: Once | INTRAVENOUS | Status: DC
  Filled 2011-05-25: qty 500

## 2011-05-25 MED ORDER — CITRIC ACID-SODIUM CITRATE 334-500 MG/5ML PO SOLN
ORAL | Status: AC
  Administered 2011-05-25: 30 mL via ORAL
  Filled 2011-05-25: qty 15

## 2011-05-25 MED ORDER — OXYTOCIN 10 UNIT/ML IJ SOLN
INTRAMUSCULAR | Status: AC
  Filled 2011-05-25: qty 4

## 2011-05-25 MED ORDER — SCOPOLAMINE 1 MG/3DAYS TD PT72
1.0000 | MEDICATED_PATCH | Freq: Once | TRANSDERMAL | Status: DC
  Administered 2011-05-25: 1.5 mg via TRANSDERMAL

## 2011-05-25 MED ORDER — CEFAZOLIN SODIUM 1-5 GM-% IV SOLN: 1.0000 g | INTRAVENOUS | Status: DC

## 2011-05-25 MED ORDER — NALOXONE HCL 0.4 MG/ML IJ SOLN: 0.4000 mg | INTRAMUSCULAR | Status: DC | PRN

## 2011-05-25 SURGICAL SUPPLY — 31 items
CHLORAPREP W/TINT 26ML (MISCELLANEOUS) IMPLANT
CLOTH BEACON ORANGE TIMEOUT ST (SAFETY) ×2 IMPLANT
CONTAINER PREFILL 10% NBF 15ML (MISCELLANEOUS) IMPLANT
DRAIN JACKSON PRT FLT 7MM (DRAIN) IMPLANT
DRESSING TELFA 8X3 (GAUZE/BANDAGES/DRESSINGS) IMPLANT
DRSG COVADERM 4X10 (GAUZE/BANDAGES/DRESSINGS) ×2 IMPLANT
DRSG PAD ABDOMINAL 8X10 ST (GAUZE/BANDAGES/DRESSINGS) ×2 IMPLANT
ELECT REM PT RETURN 9FT ADLT (ELECTROSURGICAL) ×2
ELECTRODE REM PT RTRN 9FT ADLT (ELECTROSURGICAL) ×1 IMPLANT
EVACUATOR SILICONE 100CC (DRAIN) IMPLANT
EXTRACTOR VACUUM M CUP 4 TUBE (SUCTIONS) IMPLANT
GAUZE SPONGE 4X4 12PLY STRL LF (GAUZE/BANDAGES/DRESSINGS) ×2 IMPLANT
GLOVE BIO SURGEON STRL SZ7 (GLOVE) ×2 IMPLANT
GLOVE BIOGEL PI IND STRL 7.0 (GLOVE) ×2 IMPLANT
GLOVE BIOGEL PI INDICATOR 7.0 (GLOVE) ×2
GOWN PREVENTION PLUS LG XLONG (DISPOSABLE) ×4 IMPLANT
KIT ABG SYR 3ML LUER SLIP (SYRINGE) ×4 IMPLANT
NEEDLE HYPO 25X5/8 SAFETYGLIDE (NEEDLE) ×4 IMPLANT
NS IRRIG 1000ML POUR BTL (IV SOLUTION) ×2 IMPLANT
PACK C SECTION WH (CUSTOM PROCEDURE TRAY) ×2 IMPLANT
PAD ABD 7.5X8 STRL (GAUZE/BANDAGES/DRESSINGS) ×2 IMPLANT
RTRCTR C-SECT PINK 25CM LRG (MISCELLANEOUS) ×2 IMPLANT
SLEEVE SCD COMPRESS KNEE MED (MISCELLANEOUS) ×2 IMPLANT
STAPLER VISISTAT 35W (STAPLE) ×2 IMPLANT
SUT VIC AB 0 CTX 36 (SUTURE) ×5
SUT VIC AB 0 CTX36XBRD ANBCTRL (SUTURE) ×5 IMPLANT
SUT VIC AB 4-0 KS 27 (SUTURE) IMPLANT
TAPE CLOTH SURG 4X10 WHT LF (GAUZE/BANDAGES/DRESSINGS) ×2 IMPLANT
TOWEL OR 17X24 6PK STRL BLUE (TOWEL DISPOSABLE) ×4 IMPLANT
TRAY FOLEY CATH 14FR (SET/KITS/TRAYS/PACK) ×2 IMPLANT
WATER STERILE IRR 1000ML POUR (IV SOLUTION) ×2 IMPLANT

## 2011-05-25 NOTE — Anesthesia Preprocedure Evaluation (Addendum)
Anesthesia Evaluation  Patient identified by MRN, date of birth, ID band Patient awake    Reviewed: Allergy & Precautions, H&P , Patient's Chart, lab work & pertinent test results  Airway Mallampati: III TM Distance: >3 FB Neck ROM: full    Dental No notable dental hx.    Pulmonary neg pulmonary ROS,  clear to auscultation  Pulmonary exam normal       Cardiovascular hypertension, neg cardio ROS regular Normal    Neuro/Psych Negative Neurological ROS  Negative Psych ROS   GI/Hepatic negative GI ROS, Neg liver ROS,   Endo/Other  Negative Endocrine ROSMorbid obesity  Renal/GU negative Renal ROS     Musculoskeletal   Abdominal   Peds  Hematology negative hematology ROS (+)   Anesthesia Other Findings   Reproductive/Obstetrics (+) Pregnancy                           Anesthesia Physical Anesthesia Plan  ASA: III and Emergent  Anesthesia Plan: Spinal   Post-op Pain Management:    Induction:   Airway Management Planned:   Additional Equipment:   Intra-op Plan:   Post-operative Plan:   Informed Consent: I have reviewed the patients History and Physical, chart, labs and discussed the procedure including the risks, benefits and alternatives for the proposed anesthesia with the patient or authorized representative who has indicated his/her understanding and acceptance.     Plan Discussed with:   Anesthesia Plan Comments:         Anesthesia Quick Evaluation

## 2011-05-25 NOTE — Anesthesia Procedure Notes (Signed)
Spinal  Patient location during procedure: OR Start time: 05/25/2011 5:09 PM Staffing Performed by: anesthesiologist  Preanesthetic Checklist Completed: patient identified, site marked, surgical consent, pre-op evaluation, timeout performed, IV checked, risks and benefits discussed and monitors and equipment checked Spinal Block Patient position: sitting Prep: DuraPrep Patient monitoring: heart rate, cardiac monitor, continuous pulse ox and blood pressure Approach: midline Location: L3-4 Injection technique: single-shot Needle Needle type: Sprotte  Needle gauge: 24 G Needle length: 9 cm Assessment Sensory level: T4 Additional Notes Patient identified.  Risk benefits discussed including failed block, incomplete pain control, headache, nerve damage, paralysis, blood pressure changes, nausea, vomiting, reactions to medication both toxic or allergic, and postpartum back pain.  Patient expressed understanding and wished to proceed.  All questions were answered.  Sterile technique used throughout procedure.  CSF was clear.  No parasthesia or other complications.  Please see nursing notes for vital signs.

## 2011-05-25 NOTE — Consult Note (Signed)
Neonatology Note:   Attendance at C-section:    I was asked to attend this Stat repeat C/S at 38 weeks due to abruptio placenta and acute onset of vaginal bleeding. The mother is a G2P1 B pos, GBS neg with onset of labor at 38 3/7 weeks and suprapubic tenderness. An U/S showed an abruption, but there was no bleeding at that time. BPP was 8/8. Shortly after, large amounts of blood were passed per vagina and a stat c/s was done.. ROM at delivery, fluid bloody. Infant with good tone and HR, somewhat slow to cry. There was a large amount of fresh blood in the baby's mouth, throat, and nares which we bulb suctioned. We then did DeLee suctioning to the stomach, but there was no blood in the stomach. The baby pinked up over the next few minutes and cried better. Ap 6/9. Lungs clear to ausc in DR, perfusion good. To CN to care of Pediatrician.   Deatra James, MD

## 2011-05-25 NOTE — ED Provider Notes (Signed)
Ann Frey y.o.G2P1001 @[redacted]w[redacted]d  Chief Complaint  Patient presents with  . Abdominal Pain    SUBJECTIVE  HPI: Reports onset today of UCs about twice every 6 minutes perceived only in lower abdomen. NO leakage of fluid, vaginal   PNC at [facility] for [facility] and previous C/S  Past Medical History  Diagnosis Date  . No pertinent past medical history   . Hypertension    Past Surgical History  Procedure Date  . Cesarean section    History   Social History  . Marital Status: Married    Spouse Name: N/A    Number of Children: N/A  . Years of Education: N/A   Occupational History  . Not on file.   Social History Main Topics  . Smoking status: Never Smoker   . Smokeless tobacco: Not on file  . Alcohol Use: No  . Drug Use: No  . Sexually Active: Yes   Other Topics Concern  . Not on file   Social History Narrative  . No narrative on file   No current facility-administered medications on file prior to encounter.   Current Outpatient Prescriptions on File Prior to Encounter  Medication Sig Dispense Refill  . acetaminophen (TYLENOL) 500 MG tablet Take 1,000 mg by mouth as needed. Pain       . methyldopa (ALDOMET) 500 MG tablet Take 1 tablet (500 mg total) by mouth 2 (two) times daily.  60 tablet  2  . prenatal vitamin w/FE, FA (PRENATAL 1 + 1) 27-1 MG TABS Take 1 tablet by mouth daily.         No Known Allergies  ROS: Pertinent items in HPI  OBJECTIVE  BP 151/81  Pulse 105  Temp(Src) 98.4 F (36.9 C) (Oral)  Resp 16  Ht 5\' 3"  (1.6 m)  Wt 108.863 kg (240 lb)  BMI 42.51 kg/m2  LMP 08/21/2010     ASSESSMENT AND PLAN  25 yo female with HTN and abruption.  Baseline tachycardia.   Patient consented fro cesarean section.  Risks include but not limited to bleeding, infection, damage to intraabdominal organs, hysterectomy, DVT/PE. Ancef for prophylaxis. Type and screen x2 units OR and anesthesia aware.

## 2011-05-25 NOTE — Op Note (Signed)
Ann Frey PROCEDURE DATE: 05/25/2011  PREOPERATIVE DIAGNOSIS: Intrauterine pregnancy at  [redacted]w[redacted]d weeks gestation  POSTOPERATIVE DIAGNOSIS: The same  PROCEDURE:    Low Transverse Cesarean Section  SURGEON:  Dr. Elsie Lincoln  ASSISTANT: None  INDICATIONS: Ann Frey is a 25 y.o. G2P2001 at [redacted]w[redacted]d with placental abruption for repeat cesarean section.  The risks of cesarean section discussed with the patient included but were not limited to: bleeding which may require transfusion or reoperation; infection which may require antibiotics; injury to bowel, bladder, ureters or other surrounding organs; injury to the fetus; need for additional procedures including hysterectomy in the event of a life-threatening hemorrhage; placental abnormalities wth subsequent pregnancies, incisional problems, thromboembolic phenomenon and other postoperative/anesthesia complications. The patient concurred with the proposed plan, giving informed written consent for the procedure.    FINDINGS:  Large amount of vaginal bleeding on OR bed covering bed and dripping onto floor after spinal was administered.  Viable female infant in cephalic presentation, large amount of blood and clot in uterus upon opening uterus. bloody amniotic fluid.  Intact placenta, three vessel cord.  Grossly normal uterus, ovaries and fallopian tubes.  Paratubal cyst on left fallopian tube removed. .   ANESTHESIA:   Spinal ESTIMATED BLOOD LOSS: 800cc SPECIMENS: Placenta sent to pathology COMPLICATIONS: None immediate  PROCEDURE IN DETAIL:  The patient received intravenous antibiotics and had sequential compression devices applied to her lower extremities.  She was then taken to the operating room where spinal anesthesia was administered and was found to be adequate. She was then placed in a dorsal supine position with a leftward tilt, and prepped and draped in a sterile manner.  A foley catheter was placed into her bladder and attached to constant  gravity.  After an adequate timeout was performed, a Pfannenstiel skin incision was made with scalpel and carried through to the underlying layer of fascia. The fascia was incised in the midline and this incision was extended bilaterally using the Mayo scissors. Kocher clamps were applied to the superior aspect of the fascial incision and the underlying rectus muscles were dissected off bluntly. A similar process was carried out on the inferior aspect of the facial incision. The rectus muscles were separated in the midline bluntly and the peritoneum was entered bluntly. There was a large amount of scar tissue involving the peritoneum and rectus muscles.  A Maylard incision was used on the left rectus muscle to obtain access to the peritoneal cavity.    A transverse hysterotomy was made with a scalpel and extended bilaterally bluntly. The bladder blade was then removed. The infant was successfully delivered, and cord was clamped and cut and infant was handed over to awaiting neonatology team. Uterine massage was then administered and the placenta delivered intact with three-vessel cord. The uterus was then exteriorized and cleared of clot and debris. The peritoneum and rectus muscles were noted to be hemostatic.  The fascia was closed with 0-Vicryl in a running fashion with good restoration of anatomy.  The subcutaneus tissue was copiously irrigated.  The skin was closed with staples.  Pt tolerated the procedure will.  All counts were correct x2.  Pt went to the recovery room in stable condition.

## 2011-05-25 NOTE — H&P (Signed)
Ann Frey is a 25 y.o. female G2P1001 at 38 weeks 3 days presenting for contractions.  Patient presented with abdominal pains/contractions. Patient's prior pregnancy was cesarean section and was scheduled for c-section in four days from today. Patient denied bloody discharge, vaginal discharge, pain with urination. Patient has had on going hypertension throughout prenatal care and has been managed with methyldopa.   Maternal Medical History:  Reason for admission: Reason for admission: contractions.  Contractions: Frequency: regular.   Duration is approximately 3 minutes.   Perceived severity is moderate.    Fetal activity: Perceived fetal activity is normal.   Last perceived fetal movement was within the past hour.    Prenatal complications: Hypertension.   No bleeding.   Prenatal Complications - Diabetes: none.     OB History    Grav Para Term Preterm Abortions TAB SAB Ect Mult Living   2 1 1  0 0 0 0 0 0 1     Past Medical History  Diagnosis Date  . No pertinent past medical history   . Hypertension    Past Surgical History  Procedure Date  . Cesarean section    Family History: family history includes Diabetes in her mother and Hypertension in her father. Social History:  reports that she has never smoked. She does not have any smokeless tobacco history on file. She reports that she does not drink alcohol or use illicit drugs.  Review of Systems  Constitutional: Negative for fever and chills.  Cardiovascular: Negative for chest pain and palpitations.    Dilation: Closed Effacement (%): Thick Station: Ballotable;-3 Exam by:: L Davis RN Blood pressure 150/80, pulse 102, temperature 98.4 F (36.9 C), temperature source Oral, resp. rate 18, height 5\' 3"  (1.6 m), weight 108.863 kg (240 lb), last menstrual period 08/21/2010. Maternal Exam:  Uterine Assessment: Contraction strength is moderate.  Contraction duration is 3 minutes. Contraction frequency is regular.    Abdomen: Patient reports the following abdominal tenderness: suprapubic.  Surgical scars: low transverse.   Fundal height is term.    Introitus: Normal vulva. Normal vagina.  Cervix: Cervix evaluated by digital exam.     Fetal Exam Fetal Monitor Review: Mode: ultrasound.   Baseline rate: 160.  Variability: moderate (6-25 bpm).   Pattern: accelerations present.    Fetal State Assessment: Category I - tracings are normal.    Fetal activity monitor initially was without accelerations upon initial observation in MAU, but presented with accelerations with NST. Fetal heart rate remained at 160 bpm throughout MAU stay.  Physical Exam  Constitutional: She is oriented to person, place, and time. She appears well-developed and well-nourished.  HENT:  Head: Normocephalic.  Eyes: Pupils are equal, round, and reactive to light.  Neck: Normal range of motion.  Cardiovascular: Normal rate, regular rhythm, normal heart sounds and intact distal pulses.   Respiratory: Effort normal and breath sounds normal.  GI: Soft. There is tenderness in the suprapubic area.  Genitourinary: Vagina normal and uterus normal.  Musculoskeletal: Normal range of motion.  Neurological: She is alert and oriented to person, place, and time.  Skin: Skin is warm.      Prenatal labs: ABO, Rh: B/POS/-- (09/17 1050) Antibody: Negative (05/31 0000) Rubella: Immune (05/31 0000) RPR: NON REAC (09/10 0934)  HBsAg: Negative (05/31 0000)  HIV: NON REACTIVE (09/10 0934)  GBS: Negative (11/12 0000)   Assessment/Plan: 1. Contractions/abdominal pain: patient was monitored in the MAU for hypertension and fetal activity. A BPP ultrasound was ordered to assess fetal status (  8/8) suspicious of abruption. Cesarean was indicated and during preparation for transfer to OR, patient began to spontaneously bleed. Patient will be transferred to OR for emergent cesarean. 2. Hypertension: will be closely monitored during and after  operation.  Mosetta Putt 05/25/2011, 4:51 PM   Addendum: PNC at [facility] d/t CHTN, GDM. Scheduled for RLTCS in 4 d Feels "contraction pain " in lower abd only Reports good glycemic control on glyburide  Abd: soft b/t UCs palpate mild, EFW 8# Cx closed/long by RN exam  Reviewed FHR tracing: FHR baseline 160, mod variability, NR -> to [place] for BPP/AFI Toco: reg UCs q51min  C/w Dr. Penne Lash after [place].

## 2011-05-25 NOTE — Anesthesia Preprocedure Evaluation (Signed)
Anesthesia Evaluation  Patient identified by MRN, date of birth, ID band Patient awake    Reviewed: Allergy & Precautions, H&P , Patient's Chart, lab work & pertinent test results  Airway Mallampati: III TM Distance: >3 FB Neck ROM: full    Dental No notable dental hx.    Pulmonary neg pulmonary ROS,  clear to auscultation  Pulmonary exam normal       Cardiovascular hypertension, neg cardio ROS regular Normal    Neuro/Psych Negative Neurological ROS  Negative Psych ROS   GI/Hepatic negative GI ROS, Neg liver ROS,   Endo/Other  Negative Endocrine ROSMorbid obesity  Renal/GU negative Renal ROS     Musculoskeletal   Abdominal   Peds  Hematology negative hematology ROS (+)   Anesthesia Other Findings   Reproductive/Obstetrics (+) Pregnancy                           Anesthesia Physical Anesthesia Plan  ASA: III and Emergent  Anesthesia Plan: Spinal   Post-op Pain Management:    Induction:   Airway Management Planned:   Additional Equipment:   Intra-op Plan:   Post-operative Plan:   Informed Consent: I have reviewed the patients History and Physical, chart, labs and discussed the procedure including the risks, benefits and alternatives for the proposed anesthesia with the patient or authorized representative who has indicated his/her understanding and acceptance.     Plan Discussed with:   Anesthesia Plan Comments:         Anesthesia Quick Evaluation  

## 2011-05-25 NOTE — H&P (Signed)
Pt not GDM Agree with above note.  Brody Kump H. 05/25/2011 9:23 PM

## 2011-05-25 NOTE — Progress Notes (Signed)
Onset of lower abdominal pain x 1 1/2 hours, feels like contractions, no vaginal bleeding, 38.3 weeks G2P1

## 2011-05-25 NOTE — Anesthesia Postprocedure Evaluation (Signed)
Anesthesia Post Note  Patient: Ann Frey  Procedure(s) Performed:  CESAREAN SECTION - Repeat cesarean section with delivery of baby boy at 46. Apgars 6/9.  Anesthesia type: Spinal  Patient location: PACU  Post pain: Pain level controlled  Post assessment: Post-op Vital signs reviewed  Last Vitals:  Filed Vitals:   05/25/11 1845  BP: 132/84  Pulse: 97  Temp:   Resp: 20    Post vital signs: Reviewed  Level of consciousness: awake  Complications: No apparent anesthesia complications

## 2011-05-25 NOTE — ED Notes (Signed)
N. Bascom Levels CNM took [place] report from Dr. Chilton Si and notified Dr. Penne Lash of [place] results. Plan to prep for OR.

## 2011-05-25 NOTE — Transfer of Care (Signed)
Immediate Anesthesia Transfer of Care Note  Patient: Ann Frey  Procedure(s) Performed:  CESAREAN SECTION - Repeat cesarean section with delivery of baby boy at 44. Apgars  Patient Location: PACU  Anesthesia Type: Spinal  Level of Consciousness: awake, alert  and oriented  Airway & Oxygen Therapy: Patient Spontanous Breathing and Patient connected to nasal cannula oxygen  Post-op Assessment: Report given to PACU RN and Post -op Vital signs reviewed and stable  Post vital signs: stable  Complications: No apparent anesthesia complications

## 2011-05-26 ENCOUNTER — Other Ambulatory Visit: Payer: Medicaid Other

## 2011-05-26 ENCOUNTER — Inpatient Hospital Stay (HOSPITAL_COMMUNITY): Admission: RE | Admit: 2011-05-26 | Discharge: 2011-05-26 | Payer: Medicaid Other | Source: Ambulatory Visit

## 2011-05-26 ENCOUNTER — Encounter (HOSPITAL_COMMUNITY): Payer: Self-pay | Admitting: Obstetrics & Gynecology

## 2011-05-26 DIAGNOSIS — O1410 Severe pre-eclampsia, unspecified trimester: Secondary | ICD-10-CM | POA: Diagnosis present

## 2011-05-26 DIAGNOSIS — O139 Gestational [pregnancy-induced] hypertension without significant proteinuria, unspecified trimester: Secondary | ICD-10-CM

## 2011-05-26 LAB — CBC
MCV: 81.5 fL (ref 78.0–100.0)
Platelets: 163 10*3/uL (ref 150–400)
RBC: 3.62 MIL/uL — ABNORMAL LOW (ref 3.87–5.11)
WBC: 10 10*3/uL (ref 4.0–10.5)

## 2011-05-26 MED ORDER — MEASLES, MUMPS & RUBELLA VAC ~~LOC~~ INJ
0.5000 mL | INJECTION | Freq: Once | SUBCUTANEOUS | Status: DC
  Filled 2011-05-26: qty 0.5

## 2011-05-26 MED ORDER — ONDANSETRON HCL 4 MG/2ML IJ SOLN: 4.0000 mg | INTRAMUSCULAR | Status: DC | PRN

## 2011-05-26 MED ORDER — WITCH HAZEL-GLYCERIN EX PADS: 1.0000 "application " | MEDICATED_PAD | CUTANEOUS | Status: DC | PRN

## 2011-05-26 MED ORDER — DIBUCAINE 1 % RE OINT: 1.0000 "application " | TOPICAL_OINTMENT | RECTAL | Status: DC | PRN

## 2011-05-26 MED ORDER — LABETALOL HCL 200 MG PO TABS
200.0000 mg | ORAL_TABLET | Freq: Two times a day (BID) | ORAL | Status: DC
  Filled 2011-05-26: qty 1

## 2011-05-26 MED ORDER — LABETALOL HCL 200 MG PO TABS
200.0000 mg | ORAL_TABLET | Freq: Two times a day (BID) | ORAL | Status: DC
  Administered 2011-05-26 – 2011-05-28 (×3): 200 mg via ORAL
  Filled 2011-05-26 (×4): qty 1

## 2011-05-26 MED ORDER — LANOLIN HYDROUS EX OINT: 1.0000 "application " | TOPICAL_OINTMENT | CUTANEOUS | Status: DC | PRN

## 2011-05-26 MED ORDER — PRENATAL PLUS 27-1 MG PO TABS
1.0000 | ORAL_TABLET | Freq: Every day | ORAL | Status: DC
  Administered 2011-05-26 – 2011-05-28 (×3): 1 via ORAL
  Filled 2011-05-26 (×3): qty 1

## 2011-05-26 MED ORDER — LACTATED RINGERS IV SOLN: INTRAVENOUS | Status: DC

## 2011-05-26 MED ORDER — OXYCODONE-ACETAMINOPHEN 5-325 MG PO TABS
1.0000 | ORAL_TABLET | ORAL | Status: DC | PRN
Start: 2011-05-26 — End: 2011-05-28
  Administered 2011-05-26 – 2011-05-27 (×6): 1 via ORAL
  Administered 2011-05-28: 2 via ORAL
  Administered 2011-05-28: 1 via ORAL
  Filled 2011-05-26 (×2): qty 1
  Filled 2011-05-26: qty 2
  Filled 2011-05-26 (×5): qty 1

## 2011-05-26 MED ORDER — OXYTOCIN 20 UNITS IN LACTATED RINGERS INFUSION - SIMPLE
125.0000 mL/h | INTRAVENOUS | Status: AC
  Filled 2011-05-26: qty 1000

## 2011-05-26 MED ORDER — DIPHENHYDRAMINE HCL 25 MG PO CAPS: 25.0000 mg | ORAL_CAPSULE | Freq: Four times a day (QID) | ORAL | Status: DC | PRN

## 2011-05-26 MED ORDER — ONDANSETRON HCL 4 MG PO TABS: 4.0000 mg | ORAL_TABLET | ORAL | Status: DC | PRN

## 2011-05-26 MED ORDER — MENTHOL 3 MG MT LOZG: 1.0000 | LOZENGE | OROMUCOSAL | Status: DC | PRN

## 2011-05-26 MED ORDER — SIMETHICONE 80 MG PO CHEW
80.0000 mg | CHEWABLE_TABLET | ORAL | Status: DC | PRN
  Administered 2011-05-26 – 2011-05-28 (×5): 80 mg via ORAL

## 2011-05-26 MED ORDER — TETANUS-DIPHTH-ACELL PERTUSSIS 5-2.5-18.5 LF-MCG/0.5 IM SUSP
0.5000 mL | Freq: Once | INTRAMUSCULAR | Status: DC
  Filled 2011-05-26: qty 0.5

## 2011-05-26 MED ORDER — IBUPROFEN 600 MG PO TABS
600.0000 mg | ORAL_TABLET | Freq: Four times a day (QID) | ORAL | Status: DC
  Administered 2011-05-26 – 2011-05-28 (×9): 600 mg via ORAL
  Filled 2011-05-26 (×9): qty 1

## 2011-05-26 NOTE — Progress Notes (Signed)
UR chart review completed.  

## 2011-05-26 NOTE — Anesthesia Postprocedure Evaluation (Signed)
  Anesthesia Post-op Note  Patient: Ann Frey  Procedure(s) Performed:  CESAREAN SECTION  Patient Location: A-ICU  Anesthesia Type: Spinal  Level of Consciousness: awake, alert  and oriented  Airway and Oxygen Therapy: Patient Spontanous Breathing  Post-op Pain: none  Post-op Assessment: Post-op Vital signs reviewed and Patient's Cardiovascular Status Stable  Post-op Vital Signs: Reviewed and stable  Complications: No apparent anesthesia complications

## 2011-05-26 NOTE — Progress Notes (Signed)
Patient ID: Ann Frey, female   DOB: 1986-03-10, 25 y.o.   MRN: [id number]  S/O:  Doing well.          Magnesium sulfate turned off at 5pm          Denies headache, visual changes or abdominal pain     AVSS   Labetalol held earlier due to low BP.      Filed Vitals:   05/26/11 1700  BP: 122/54  Pulse: 100  Temp:   Resp: 18   I/O last 3 completed shifts: In: 4639 [P.O.:720; I.V.:3919] Out: 1975 [Urine:1175; Blood:800] Total I/O In: 1736.7 [P.O.:720; I.V.:1016.7] Out: 2550 [Urine:2550]  CBC    Component Value Date/Time   WBC 10.0 05/26/2011 0859   RBC 3.62* 05/26/2011 0859   HGB 9.8* 05/26/2011 0859   HCT 29.5* 05/26/2011 0859   PLT 163 05/26/2011 0859   MCV 81.5 05/26/2011 0859   MCH 27.1 05/26/2011 0859   MCHC 33.2 05/26/2011 0859   RDW 13.7 05/26/2011 0859   LYMPHSABS 1.7 01/12/2011 1346   MONOABS 0.7 01/12/2011 1346   EOSABS 0.1 01/12/2011 1346   BASOSABS 0.0 01/12/2011 1346      A:  POD #1      Severe Preeclampsia     S/P Abruption      Mild Anemia secondary to above  P:  Transfer to floor

## 2011-05-26 NOTE — Progress Notes (Addendum)
Post Operative Day #1 Subjective: no complaints and tolerating PO No flatus yet.  Minimal to moderate pain. Slight headache, no visual changes.  Objective: Blood pressure 128/74, pulse 107, temperature 98 F (36.7 C), temperature source Oral, resp. rate 22, height 5\' 3"  (1.6 m), weight 105.733 kg (233 lb 1.6 oz), last menstrual period 08/21/2010, SpO2 100.00%, unknown if currently breastfeeding.  Physical Exam:  General: alert, cooperative, no distress and moderately obese Lochia: appropriate Uterine Fundus: firm Incision: no significant drainage DVT Evaluation: No evidence of DVT seen on physical exam. DTRs normal, no clonus 2+ edema of feet, trace in hands    Basename 05/25/11 1630  HGB 12.5  HCT 36.7   I/O last 3 completed shifts: In: 4639 [P.O.:720; I.V.:3919] Out: 1975 [Urine:1175; Blood:800] Total I/O In: 100 [I.V.:100] Out: 150 [Urine:150]     Assessment/Plan: Breastfeeding A:  POD #1      Severe Preeclampsia  P:  Routine AICU care      CBC was ordered for tomorrow... Will change to today      Advance diet as tolerated      Continue Magnesium Sulfate for 24 hours    LOS: 1 day   [facility] 05/26/2011, 8:55 AM

## 2011-05-27 NOTE — Progress Notes (Signed)
Subjective: Postpartum Day 2: Cesarean Delivery Patient reports tolerating PO, + flatus, + BM and no problems voiding.    Objective: Vital signs in last 24 hours: Temp:  [97.8 F (36.6 C)-98.8 F (37.1 C)] 98.1 F (36.7 C) (11/20 0615) Pulse Rate:  [80-120] 80  (11/20 0615) Resp:  [16-20] 20  (11/20 0615) BP: (104-134)/(51-80) 127/69 mmHg (11/20 0615) SpO2:  [98 %-100 %] 100 % (11/20 0615)  Physical Exam:  General: alert, cooperative and no distress Lochia: appropriate Uterine Fundus: firm Incision: Dressing clean and dry DVT Evaluation: No evidence of DVT seen on physical exam. Negative Homan's sign.   Basename 05/26/11 0859 05/25/11 1630  HGB 9.8* 12.5  HCT 29.5* 36.7    Assessment/Plan: Status post Cesarean section. Doing well postoperatively. May remove dressing in shower. Continue current care. Discharge in AM.  Ann Frey 05/27/2011, 10:00 AM

## 2011-05-28 ENCOUNTER — Other Ambulatory Visit: Payer: Medicaid Other

## 2011-05-28 LAB — MRSA CULTURE

## 2011-05-28 MED ORDER — OXYCODONE-ACETAMINOPHEN 5-325 MG PO TABS: 1.0000 | ORAL_TABLET | ORAL | Status: AC | PRN

## 2011-05-28 MED ORDER — IBUPROFEN 600 MG PO TABS: 600.0000 mg | ORAL_TABLET | Freq: Four times a day (QID) | ORAL | Status: AC

## 2011-05-28 MED ORDER — SENNA-DOCUSATE SODIUM 8.6-50 MG PO TABS: 1.0000 | ORAL_TABLET | Freq: Every day | ORAL | Status: AC

## 2011-05-28 NOTE — Progress Notes (Signed)
Post-Op Day #3 Subjective: no complaints, up ad lib, voiding, tolerating PO and + flatus  Objective: Blood pressure 124/81, pulse 90, temperature 97.5 F (36.4 C), temperature source Oral, resp. rate 18, height 5\' 3"  (1.6 m), weight 105.733 kg (233 lb 1.6 oz), last menstrual period 08/21/2010, SpO2 100.00%, unknown if currently breastfeeding.  Physical Exam:  General: alert, cooperative and no distress Lochia: appropriate Uterine Fundus: firm Incision: healing well, no significant drainage, no dehiscence, no significant erythema DVT Evaluation: No evidence of DVT seen on physical exam. No cords or calf tenderness.   Basename 05/26/11 0859 05/25/11 1630  HGB 9.8* 12.5  HCT 29.5* 36.7    Assessment/Plan: Discharge home, Breastfeeding and Contraception Depo, considering nexplanon long term   LOS: 3 days   Drucie Ip 05/28/2011, 7:31 AM

## 2011-05-28 NOTE — Discharge Summary (Deleted)
Obstetric Discharge Summary Reason for Admission: cesarean section Prenatal Procedures: none Intrapartum Procedures: cesarean: low cervical, transverse Postpartum Procedures: none Complications-Operative and Postpartum: none Hemoglobin  Date Value Range Status  05/26/2011 9.8* 12.0-15.0 (g/dL) Final     DELTA CHECK NOTED     REPEATED TO VERIFY     HCT  Date Value Range Status  05/26/2011 29.5* 36.0-46.0 (%) Final    Discharge Diagnoses: Term Pregnancy-delivered  Discharge Information: Date: 05/28/2011 Activity: pelvic rest and for 6 weeks Diet: routine Medications: Ibuprofen and Percocet Condition: stable Instructions: refer to practice specific booklet Discharge to: home Follow-up Information    Follow up with [facility] today. (Please follow-up in 2weeks for incision check and depo shot)    Contact information:   [address] [phone]          Newborn Data: Live born female  Birth Weight: 7 lb 13.4 oz (3555 g) APGAR: 6, 9  Home with mother.  Drucie Ip 05/28/2011, 7:41 AM

## 2011-05-28 NOTE — Discharge Summary (Addendum)
Obstetric Discharge Summary Reason for Admission: cesarean section Prenatal Procedures: Cronic abruption Intrapartum Procedures: cesarean: low cervical, transverse Postpartum Procedures: none Complications-Operative and Postpartum: Chronic hypertension, kept of Mg 24 hours post partum Hemoglobin  Date Value Range Status  05/26/2011 9.8* 12.0-15.0 (g/dL) Final     DELTA CHECK NOTED     REPEATED TO VERIFY     HCT  Date Value Range Status  05/26/2011 29.5* 36.0-46.0 (%) Final    Discharge Diagnoses: Term Pregnancy-delivered  Discharge Information: Date: 05/28/2011 Activity: pelvic rest and for 6 weeks Diet: routine Medications: Ibuprofen and Percocet Condition: stable Instructions: refer to practice specific booklet Discharge to: home Follow-up Information    Follow up with [facility] today. (Please follow-up in 2weeks for incision check and depo shot)    Contact information:   [address] [phone]          Newborn Data: Live born female  Birth Weight: 7 lb 13.4 oz (3555 g) APGAR: 6, 9  Home with mother.  Drucie Ip 05/28/2011, 7:58 AM

## 2011-05-29 LAB — TYPE AND SCREEN
ABO/RH(D): B POS
Unit division: 0
Unit division: 0

## 2011-05-31 ENCOUNTER — Inpatient Hospital Stay (HOSPITAL_COMMUNITY)
Admission: RE | Admit: 2011-05-31 | Payer: Medicaid Other | Source: Ambulatory Visit | Admitting: Obstetrics & Gynecology

## 2011-05-31 ENCOUNTER — Encounter (HOSPITAL_COMMUNITY): Admission: RE | Payer: Self-pay | Source: Ambulatory Visit

## 2011-05-31 SURGERY — Surgical Case
Anesthesia: Regional

## 2011-06-02 ENCOUNTER — Ambulatory Visit (INDEPENDENT_AMBULATORY_CARE_PROVIDER_SITE_OTHER): Payer: Medicaid Other | Admitting: Obstetrics and Gynecology

## 2011-06-02 ENCOUNTER — Encounter: Payer: Self-pay | Admitting: Obstetrics and Gynecology

## 2011-06-02 ENCOUNTER — Ambulatory Visit: Payer: Medicaid Other

## 2011-06-02 DIAGNOSIS — IMO0001 Reserved for inherently not codable concepts without codable children: Secondary | ICD-10-CM

## 2011-06-02 DIAGNOSIS — Z09 Encounter for follow-up examination after completed treatment for conditions other than malignant neoplasm: Secondary | ICD-10-CM

## 2011-06-02 DIAGNOSIS — Z3049 Encounter for surveillance of other contraceptives: Secondary | ICD-10-CM

## 2011-06-02 MED ORDER — MEDROXYPROGESTERONE ACETATE 150 MG/ML IM SUSP
150.0000 mg | INTRAMUSCULAR | Status: AC
  Administered 2011-06-02: 150 mg via INTRAMUSCULAR

## 2011-06-02 NOTE — Progress Notes (Signed)
Staples removed from low abdominal transverse c-section. Middle part of the staple were embedded and bled a small amount. Cleaned with 4X4 gauze and saline. Patient tolerated well.  No redness, no odor, afebrile. Steri strip applied with abdominal pad over it.

## 2011-06-02 NOTE — Progress Notes (Signed)
Addended by: Toula Moos on: 06/02/2011 05:03 PM   Modules accepted: Orders

## 2011-06-02 NOTE — Progress Notes (Addendum)
Ann Frey [id number] @[redacted]w[redacted]d  Chief Complaint  Patient presents with  . Wound Check    SUBJECTIVE  HPI: G2P2002 7d post op LTCS. Incisional pain is diminished and she uses Percocet at times. Breastfeeding and infant is well. She is considering Nexplanon but would like to get Depo today. Not yet sexually active post delivery. Denies bowel or bladder problems.    Past Medical History  Diagnosis Date  . No pertinent past medical history   . Hypertension    Past Surgical History  Procedure Date  . Cesarean section   . Cesarean section 05/25/2011    Procedure: CESAREAN SECTION;  Surgeon: Lesly Dukes, MD;  Location: [facility];  Service: Gynecology;  Laterality: N/A;  Repeat cesarean section with delivery of baby boy at 55. Apgars 6/9.   History   Social History  . Marital Status: Married    Spouse Name: N/A    Number of Children: N/A  . Years of Education: N/A   Occupational History  . Not on file.   Social History Main Topics  . Smoking status: Never Smoker   . Smokeless tobacco: Not on file  . Alcohol Use: No  . Drug Use: No  . Sexually Active: Yes   Other Topics Concern  . Not on file   Social History Narrative  . No narrative on file   Current Outpatient Prescriptions on File Prior to Visit  Medication Sig Dispense Refill  . ibuprofen (ADVIL,MOTRIN) 600 MG tablet Take 1 tablet (600 mg total) by mouth every 6 (six) hours.  30 tablet  0  . methyldopa (ALDOMET) 500 MG tablet Take 1 tablet (500 mg total) by mouth 2 (two) times daily.  60 tablet  2  . oxyCODONE-acetaminophen (PERCOCET) 5-325 MG per tablet Take 1-2 tablets by mouth every 3 (three) hours as needed (moderate - severe pain).  20 tablet  0  . prenatal vitamin w/FE, FA (PRENATAL 1 + 1) 27-1 MG TABS Take 1 tablet by mouth daily.        . sennosides-docusate sodium (SENOKOT-S) 8.6-50 MG tablet Take 1 tablet by mouth daily.  20 tablet  0   No Known Allergies  ROS: Pertinent items in  HPI  OBJECTIVE  BP 131/81  Pulse 99  Temp(Src) 98.4 F (36.9 C) (Oral)  Ht 5\' 3"  (1.6 m)  Wt 226 lb 9.6 oz (102.785 kg)  BMI 40.14 kg/m2  LMP 08/21/2010  Breastfeeding? Yes  Physical Exam  Constitutional: She is oriented to person, place, and time and well-developed, well-nourished, and in no distress. No distress.  HENT:  Head: Normocephalic.  Neck: Neck supple.  Cardiovascular: Normal rate.   Pulmonary/Chest: Effort normal.  Abdominal: Soft. She exhibits no distension. There is no tenderness. There is no rebound and no guarding.       Pffanensteil incision healed with extensive eversion of  superior and inferior skin edge. No purulence or bleeding. Slight wound induration, no erythema. Staples removed, some were embedded and oozing blood with removal.  steristrips applied.  Musculoskeletal: Normal range of motion.  Neurological: She is alert and oriented to person, place, and time.  Skin: Skin is warm and dry.    ASSESSMENT  7 d post op LTCS   PLAN Staples removed.  Counseled re contraception and will give Depo today if UPT neg.  F/U in 5 wks

## 2011-06-02 NOTE — Patient Instructions (Signed)
Incision Care     An incision is when a surgeon cuts into your body tissues. After surgery, the incision needs to be cared for properly to prevent infection.   HOME CARE INSTRUCTIONS   · Take all medicine as directed by your caregiver. Only take over-the-counter or prescription medicines for pain, discomfort, or fever as directed by your caregiver.   · Do not remove your bandage (dressing) or get your incision wet without permission from your surgeon.   · Change the bandage right away if it becomes wet, dirty, or starts to smell bad.   · Take showers. Do not take tub baths, swim, or do anything that may soak the wound until it is healed.   · Resume normal diet and activities as directed or allowed.   · Avoid lifting any weight until you are instructed otherwise.   · Use anti-itch antihistamine medicine as directed by your caregiver. The wound may itch when it is healing. Do not pick or scratch at the wound.   · Follow up with your caregiver for stitches (suture) or staple removal as directed.   · Drink enough fluids to keep your urine clear or pale yellow.   SEEK MEDICAL CARE IF:   · There is redness, swelling, or increasing pain in the wound that is not controlled with medication.   · There is drainage, blood, or pus coming from the wound that lasts longer than 1 day.   · You develop muscle aches, chills, fever, or a general ill feeling.   · You notice a bad smell coming from the wound or dressing.   · The edges of the wound separate after the sutures, staples, or skin adhesive strips have been removed.   · You develop persistent nausea or vomiting.   SEEK IMMEDIATE MEDICAL CARE IF:   · You have a fever.   · You develop a rash.   · You develop dizzy episodes or faint while standing.   · You have difficulty breathing.   · You develop any reaction or side effects to medicine given.   MAKE SURE YOU:   · Understand these instructions.   · Will watch your condition.   · Will get help right away if you are not doing well  or get worse.   Document Released: 01/10/2005 Document Revised: 03/05/2011 Document Reviewed: 10/27/2010  ExitCare® Patient Information ©2012 [employer].

## 2011-06-04 NOTE — Progress Notes (Signed)
I have reviewed the reactive nst of 11/15

## 2011-06-18 ENCOUNTER — Telehealth: Payer: Self-pay | Admitting: *Deleted

## 2011-06-18 NOTE — Telephone Encounter (Signed)
Pt called with questions about her disability and requests call back. I called today and left message that I am returning her call and will try again later today.

## 2011-06-19 NOTE — Telephone Encounter (Signed)
Spoke w/pt. She stated that the disability company has requested some information from Dr. Penne Lash but has not received the information. I asked Rylin if she would contact the disability dept and give them my name and number @ clinic. They can call me and I will try to assist with the situation. Pt voiced understanding and agreed.

## 2011-06-26 ENCOUNTER — Ambulatory Visit (INDEPENDENT_AMBULATORY_CARE_PROVIDER_SITE_OTHER): Payer: Medicaid Other | Admitting: Advanced Practice Midwife

## 2011-06-26 ENCOUNTER — Encounter: Payer: Self-pay | Admitting: Advanced Practice Midwife

## 2011-06-26 MED ORDER — IBUPROFEN 200 MG PO TABS: 600.0000 mg | ORAL_TABLET | Freq: Three times a day (TID) | ORAL | Status: AC | PRN

## 2011-06-26 NOTE — Progress Notes (Signed)
  Subjective:     Ann Frey is a 25 y.o. female who presents for a postpartum visit. She is 4 weeks postpartum following a low cervical transverse Cesarean section. I have fully reviewed the prenatal and intrapartum course. The delivery was at 38 gestational weeks. Outcome: primary cesarean section, low transverse incision. Anesthesia: epidural. Postpartum course has been uneventful, except for some headaches. Baby's course has been uneventful. Baby is feeding by both breast and bottle - gerber. Bleeding staining only. Bowel function is normal. Bladder function is normal. Patient is not sexually active. Contraception method is Depo-Provera injections. Plans Nexplanon. Postpartum depression screening: negative. Has some numbness on surface of skin lower abdomen The following portions of the patient's history were reviewed and updated as appropriate: allergies, current medications, past family history, past medical history, past social history, past surgical history and problem list.  Review of Systems Pertinent items are noted in HPI.   Objective:    BP 135/79  Pulse 96  Temp(Src) 98.1 F (36.7 C) (Oral)  Ht 5\' 3"  (1.6 m)  Wt 220 lb 4.8 oz (99.927 kg)  BMI 39.02 kg/m2  Breastfeeding? Yes  General:  alert, cooperative and no distress   Breasts:  inspection negative, no nipple discharge or bleeding, no masses or nodularity palpable  Lungs: n/a  Heart:  n/a  Abdomen: soft, non-tender; bowel sounds normal; no masses,  no organomegaly   Vulva:  normal  Vagina: not evaluated  Cervix:  not eval  Corpus: not eva  Adnexa:  not eval  Rectal Exam: Not performed. and         Assessment:    Normal post operative/ postpartum exam. Pap smear not done at today's visit.   Plan:    1. Contraception: Nexplanon (needs to be placed) after DepoProvera 2. Return to normal activity 3. Follow up in: several months or as needed.  For annual exam

## 2011-06-26 NOTE — Progress Notes (Signed)
Addended by: Aviva Signs on: 06/26/2011 04:16 PM   Modules accepted: Orders

## 2011-06-27 ENCOUNTER — Telehealth: Payer: Self-pay | Admitting: *Deleted

## 2011-06-27 NOTE — Telephone Encounter (Signed)
Called and spoke w/pt. She states that she would like to have additional medical leave as discussed with Hilda Lias yesterday.  I told pt that there is nothing in Marie's note that would indicate that she is having problems or needs additional PP leave. Pt states that she is going to apply for childcare through social services and the process may be slow. I recommended that pt go today and begin the application process and also to discuss with her employer about the situation. Meanwhile, I will send message to Hilda Lias and ask for feedback. Pt voiced understanding.

## 2011-06-27 NOTE — Telephone Encounter (Signed)
Patient called and left a message she is calling Ms. Diane, calling to ask you something, not about papers

## 2011-06-27 NOTE — Telephone Encounter (Signed)
Discussed LOA from work. I told her FMLA lets her have up to 12 weeks without pay. She only wanted until mid January.  So I wrote her a letter saying back to work then. She said she is not getting paid anyway.  There is no MEDICAL reason for this, but with FMLA for childbirth, I don't think you have to have a medical reason.

## 2011-07-02 NOTE — Telephone Encounter (Signed)
Spoke w/pt and informed her that she can have up to 12 wks for maternity FMLA. Pt would like the full amount of time. Her new return to work date will be 08/18/11. She will have her employer fax a new form for completion or have her husband bring it to clinic.

## 2011-07-10 ENCOUNTER — Telehealth: Payer: Self-pay | Admitting: Obstetrics and Gynecology

## 2011-07-10 NOTE — Telephone Encounter (Signed)
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 Left a message to call us back to clarify on how many forms she has dropped off or whether Dianne needs to fax it to two different places. She stated in the message that she needs something for [facility] and something else for employer.

## 2011-07-11 NOTE — Progress Notes (Signed)
Reactive NST from 05/15/11

## 2011-07-11 NOTE — Telephone Encounter (Signed)
Spoke w/pt and clarified her request. Disability form now has corrected dates and has been faxed. Pt would also like a new letter stating her return to work date as 08/18/11.  Pt will pick up on 07/14/11 along with copy of disability form.

## 2011-07-14 ENCOUNTER — Encounter: Payer: Self-pay | Admitting: Obstetrics & Gynecology

## 2013-07-04 ENCOUNTER — Emergency Department (HOSPITAL_COMMUNITY)
Admission: EM | Admit: 2013-07-04 | Discharge: 2013-07-05 | Disposition: A | Discharge status: Home / Self Care | Payer: Self-pay | Attending: Emergency Medicine | Admitting: Emergency Medicine

## 2013-07-04 ENCOUNTER — Encounter (HOSPITAL_COMMUNITY): Payer: Self-pay | Admitting: Emergency Medicine

## 2013-07-04 ENCOUNTER — Emergency Department (HOSPITAL_COMMUNITY): Payer: Self-pay

## 2013-07-04 DIAGNOSIS — R791 Abnormal coagulation profile: Secondary | ICD-10-CM | POA: Insufficient documentation

## 2013-07-04 DIAGNOSIS — K219 Gastro-esophageal reflux disease without esophagitis: Secondary | ICD-10-CM | POA: Insufficient documentation

## 2013-07-04 DIAGNOSIS — R0609 Other forms of dyspnea: Secondary | ICD-10-CM | POA: Insufficient documentation

## 2013-07-04 DIAGNOSIS — R0989 Other specified symptoms and signs involving the circulatory and respiratory systems: Secondary | ICD-10-CM | POA: Insufficient documentation

## 2013-07-04 DIAGNOSIS — R9431 Abnormal electrocardiogram [ECG] [EKG]: Secondary | ICD-10-CM | POA: Insufficient documentation

## 2013-07-04 DIAGNOSIS — Z3202 Encounter for pregnancy test, result negative: Secondary | ICD-10-CM | POA: Insufficient documentation

## 2013-07-04 DIAGNOSIS — I10 Essential (primary) hypertension: Secondary | ICD-10-CM | POA: Insufficient documentation

## 2013-07-04 DIAGNOSIS — Z79899 Other long term (current) drug therapy: Secondary | ICD-10-CM | POA: Insufficient documentation

## 2013-07-04 DIAGNOSIS — R000 Tachycardia, unspecified: Secondary | ICD-10-CM | POA: Insufficient documentation

## 2013-07-04 LAB — BASIC METABOLIC PANEL
CO2: 27 mEq/L (ref 19–32)
Chloride: 101 mEq/L (ref 96–112)
Creatinine, Ser: 0.85 mg/dL (ref 0.50–1.10)

## 2013-07-04 LAB — CBC
Hemoglobin: 12.8 g/dL (ref 12.0–15.0)
MCV: 78.5 fL (ref 78.0–100.0)
Platelets: 238 10*3/uL (ref 150–400)
RBC: 4.92 MIL/uL (ref 3.87–5.11)
WBC: 7.4 10*3/uL (ref 4.0–10.5)

## 2013-07-04 LAB — POCT I-STAT TROPONIN I: Troponin i, poc: 0.01 ng/mL (ref 0.00–0.08)

## 2013-07-04 LAB — POCT PREGNANCY, URINE: Preg Test, Ur: NEGATIVE

## 2013-07-04 NOTE — ED Notes (Signed)
Per pt sts chest pain x 1 week with breathing. sts hurts in the middle of her chest. Denies any N,V,D. Denies cough, fever.

## 2013-07-05 ENCOUNTER — Encounter (HOSPITAL_COMMUNITY): Payer: Self-pay | Admitting: Radiology

## 2013-07-05 ENCOUNTER — Emergency Department (HOSPITAL_COMMUNITY): Payer: Self-pay

## 2013-07-05 LAB — D-DIMER, QUANTITATIVE: D-Dimer, Quant: 1 ug/mL-FEU — ABNORMAL HIGH (ref 0.00–0.48)

## 2013-07-05 MED ORDER — IOHEXOL 350 MG/ML SOLN
80.0000 mL | Freq: Once | INTRAVENOUS | Status: AC | PRN
  Administered 2013-07-05: 65 mL via INTRAVENOUS

## 2013-07-05 MED ORDER — GI COCKTAIL ~~LOC~~
30.0000 mL | Freq: Once | ORAL | Status: AC
  Administered 2013-07-05: 30 mL via ORAL
  Filled 2013-07-05: qty 30

## 2013-07-05 NOTE — ED Notes (Signed)
Patient transported to CT 

## 2013-07-05 NOTE — ED Notes (Signed)
MD at bedside. 

## 2013-07-05 NOTE — ED Provider Notes (Signed)
CSN: [id number]     Arrival date & time 07/04/13  1805 History   First MD Initiated Contact with Patient 07/05/13 0122     Chief Complaint  Patient presents with  . Chest Pain   (Consider location/radiation/quality/duration/timing/severity/associated sxs/prior Treatment) Patient is a 27 y.o. female presenting with chest pain. The history is provided by the patient.  Chest Pain She has been having chest pain for the last week. She describes an intermittent heavy feeling in the mid sternal area with radiation to the left breast. Associated dyspnea but no nausea or diaphoresis. She denies fever or cough. Denies arthralgias or myalgias. Pain is worse when she takes a deep breath. Is not affected by exertion or body position or eating. She's had pain like this before. She rates the pain at 5/10. Of note, she had been on birth control pills until about 3 weeks ago. She is a nonsmoker and denies history of hypertension (although she is noted to be on an antihypertensive medication), diabetes, hyperlipidemia. There is no family history of coronary artery disease. She has not had any recent travel or surgery or pregnancy.  Past Medical History  Diagnosis Date  . No pertinent past medical history   . Hypertension    Past Surgical History  Procedure Laterality Date  . Cesarean section    . Cesarean section  05/25/2011    Procedure: CESAREAN SECTION;  Surgeon: Lesly Dukes, MD;  Location: [facility];  Service: Gynecology;  Laterality: N/A;  Repeat cesarean section with delivery of baby boy at 52. Apgars 6/9.   Family History  Problem Relation Age of Onset  . Hypertension Father   . Diabetes Mother    History  Substance Use Topics  . Smoking status: Never Smoker   . Smokeless tobacco: Not on file  . Alcohol Use: No   OB History   Grav Para Term Preterm Abortions TAB SAB Ect Mult Living   2 2 2  0 0 0 0 0 0 2     Review of Systems  Cardiovascular: Positive for chest pain.  All other  systems reviewed and are negative.    Allergies  Review of patient's allergies indicates no known allergies.  Home Medications   Current Outpatient Rx  Name  Route  Sig  Dispense  Refill  . Norgestimate-Ethinyl Estradiol Triphasic (TRI-SPRINTEC) 0.18/0.215/0.25 MG-35 MCG tablet   Oral   Take 1 tablet by mouth daily.         Marland Kitchen EXPIRED: methyldopa (ALDOMET) 500 MG tablet   Oral   Take 1 tablet (500 mg total) by mouth 2 (two) times daily.   60 tablet   2    BP 115/53  Pulse 91  Temp(Src) 98.3 F (36.8 C) (Oral)  Resp 18  SpO2 100%  LMP 06/14/2013 Physical Exam  Nursing note and vitals reviewed.  27 year old female, resting comfortably and in no acute distress. Vital signs are normal. Oxygen saturation is 100%, which is normal. Head is normocephalic and atraumatic. PERRLA, EOMI. Oropharynx is clear. Neck is nontender and supple without adenopathy or JVD. Back is nontender and there is no CVA tenderness. Lungs are clear without rales, wheezes, or rhonchi. Chest is nontender. Heart has regular rate and rhythm without murmur. Abdomen is soft, flat, nontender without masses or hepatosplenomegaly and peristalsis is normoactive. Extremities have no cyanosis or edema, full range of motion is present. Skin is warm and dry without rash. Neurologic: Mental status is normal, cranial nerves are intact, there are  no motor or sensory deficits.  ED Course  Procedures (including critical care time) Labs Review Results for orders placed during the hospital encounter of 07/04/13  CBC      Result Value Range   WBC 7.4  4.0 - 10.5 K/uL   RBC 4.92  3.87 - 5.11 MIL/uL   Hemoglobin 12.8  12.0 - 15.0 g/dL   HCT 16.1  09.6 - 04.5 %   MCV 78.5  78.0 - 100.0 fL   MCH 26.0  26.0 - 34.0 pg   MCHC 33.2  30.0 - 36.0 g/dL   RDW 40.9  81.1 - 91.4 %   Platelets 238  150 - 400 K/uL  BASIC METABOLIC PANEL      Result Value Range   Sodium 137  135 - 145 mEq/L   Potassium 4.3  3.5 - 5.1 mEq/L    Chloride 101  96 - 112 mEq/L   CO2 27  19 - 32 mEq/L   Glucose, Bld 119 (*) 70 - 99 mg/dL   BUN 11  6 - 23 mg/dL   Creatinine, Ser 7.82  0.50 - 1.10 mg/dL   Calcium 9.6  8.4 - 95.6 mg/dL   GFR calc non Af Amer >90  >90 mL/min   GFR calc Af Amer >90  >90 mL/min  D-DIMER, QUANTITATIVE      Result Value Range   D-Dimer, Quant 1.00 (*) 0.00 - 0.48 ug/mL-FEU  POCT PREGNANCY, URINE      Result Value Range   Preg Test, Ur NEGATIVE  NEGATIVE  POCT I-STAT TROPONIN I      Result Value Range   Troponin i, poc 0.01  0.00 - 0.08 ng/mL   Comment 3            Imaging Review Dg Chest 2 View  07/04/2013   CLINICAL DATA:  Mid chest pain  EXAM: CHEST  2 VIEW  COMPARISON:  December 14, 2009  FINDINGS: The heart size and mediastinal contours are within normal limits. There is no focal infiltrate, pulmonary edema, or pleural effusion. The visualized skeletal structures are unremarkable.  IMPRESSION: No active cardiopulmonary disease.   Electronically Signed   By: Sherian Rein M.D.   On: 07/04/2013 19:31   Ct Angio Chest Pe W/cm &/or Wo Cm  07/05/2013   CLINICAL DATA:  Chest pain for 1 week with difficulty breathing. Elevated D-dimer.  EXAM: CT ANGIOGRAPHY CHEST WITH CONTRAST  TECHNIQUE: Multidetector CT imaging of the chest was performed using the standard protocol during bolus administration of intravenous contrast. Multiplanar CT image reconstructions including MIPs were obtained to evaluate the vascular anatomy.  CONTRAST:  65mL OMNIPAQUE IOHEXOL 350 MG/ML SOLN  COMPARISON:  Chest radiograph July 04, 2013  FINDINGS: Suboptimal bolus timing, limits evaluation for segmental and subsegmental branch filling defects. Main pulmonary artery is not enlarged, and there are no central filling defects. Heterogeneous attenuation of the segmental and subsegmental branches diffusely.  Heart and pericardium are unremarkable. Thoracic aorta is normal. Tracheobronchial tree is patent and midline. No focal consolidations,  pleural effusions, pulmonary nodules or masses. Tracheobronchial tree is patent and midline. No pneumothorax. Included view of the abdomen is not nonsuspicious. Very small hiatal hernia.  Review of the MIP images confirms the above findings.  IMPRESSION: Suboptimal bolus timing, with no convincing evidence of pulmonary embolism (limited assessment of segmental and subsegmental branches) nor acute cardiopulmonary process.   Electronically Signed   By: Awilda Metro   On: 07/05/2013 04:14   Images  viewed by me.   EKG Interpretation    Date/Time:  Monday July 04 2013 18:09:01 EST Ventricular Rate:  127 PR Interval:  146 QRS Duration: 62 QT Interval:  292 QTC Calculation: 424 R Axis:   87 Text Interpretation:  Sinus tachycardia Nonspecific T wave abnormality Abnormal ECG No old tracing to compare Confirmed by [facility]  MD, Ramesses Crampton ([id number]) on 07/05/2013 1:33:30 AM            MDM  No diagnosis found. Chest pain which is unlikely to be anything serious. No risk factors for premature coronary artery disease. She does have risk factor for pulmonary embolism of recent use of exogenous estrogen so she will be screened for pulmonary embolism with d-dimer.  D-dimer has come out positive so she is sent for CT angiogram which is negative for pulmonary embolism. She was given a dose of a GI cocktail with significant relief of pain. Symptoms seem most consistent with GERD, so she is advised to take over-the-counter omeprazole once a day. She is to return should symptoms worsen.  Dione Booze, MD 07/05/13 [phone]

## 2014-05-08 ENCOUNTER — Encounter (HOSPITAL_COMMUNITY): Payer: Self-pay | Admitting: Radiology

## 2014-10-05 ENCOUNTER — Other Ambulatory Visit: Payer: Self-pay | Admitting: Obstetrics & Gynecology

## 2014-10-05 ENCOUNTER — Other Ambulatory Visit (HOSPITAL_COMMUNITY)
Admission: RE | Admit: 2014-10-05 | Discharge: 2014-10-05 | Disposition: A | Discharge status: Home / Self Care | Payer: BC Managed Care – PPO | Source: Ambulatory Visit | Attending: Obstetrics & Gynecology | Admitting: Obstetrics & Gynecology

## 2014-10-05 DIAGNOSIS — Z01419 Encounter for gynecological examination (general) (routine) without abnormal findings: Secondary | ICD-10-CM | POA: Diagnosis not present

## 2014-10-05 DIAGNOSIS — Z1151 Encounter for screening for human papillomavirus (HPV): Secondary | ICD-10-CM | POA: Diagnosis present

## 2014-10-09 LAB — CYTOLOGY - PAP

## 2015-04-10 ENCOUNTER — Other Ambulatory Visit: Payer: Self-pay | Admitting: Family Medicine

## 2015-04-13 ENCOUNTER — Other Ambulatory Visit: Payer: Self-pay | Admitting: Family Medicine

## 2015-04-13 DIAGNOSIS — E010 Iodine-deficiency related diffuse (endemic) goiter: Secondary | ICD-10-CM

## 2015-04-19 ENCOUNTER — Other Ambulatory Visit: Payer: BC Managed Care – PPO

## 2015-05-09 ENCOUNTER — Ambulatory Visit
Admission: RE | Admit: 2015-05-09 | Discharge: 2015-05-09 | Disposition: A | Discharge status: Home / Self Care | Payer: BC Managed Care – PPO | Source: Ambulatory Visit | Attending: Family Medicine | Admitting: Family Medicine

## 2015-05-09 DIAGNOSIS — E010 Iodine-deficiency related diffuse (endemic) goiter: Secondary | ICD-10-CM

## 2015-05-28 ENCOUNTER — Ambulatory Visit: Payer: BC Managed Care – PPO | Admitting: Neurology

## 2015-06-27 ENCOUNTER — Encounter: Payer: Self-pay | Admitting: Neurology

## 2015-06-27 ENCOUNTER — Ambulatory Visit (INDEPENDENT_AMBULATORY_CARE_PROVIDER_SITE_OTHER): Payer: BC Managed Care – PPO | Admitting: Neurology

## 2015-06-27 VITALS — BP 122/74 | HR 83 | Ht 63.0 in | Wt 217.0 lb

## 2015-06-27 DIAGNOSIS — R51 Headache: Secondary | ICD-10-CM | POA: Diagnosis not present

## 2015-06-27 DIAGNOSIS — G43109 Migraine with aura, not intractable, without status migrainosus: Secondary | ICD-10-CM | POA: Diagnosis not present

## 2015-06-27 DIAGNOSIS — G43809 Other migraine, not intractable, without status migrainosus: Secondary | ICD-10-CM | POA: Insufficient documentation

## 2015-06-27 DIAGNOSIS — R519 Headache, unspecified: Secondary | ICD-10-CM

## 2015-06-27 MED ORDER — NAPROXEN 500 MG PO TABS: 500.0000 mg | ORAL_TABLET | Freq: Two times a day (BID) | ORAL | Status: DC | PRN

## 2015-06-27 MED ORDER — NORTRIPTYLINE HCL 10 MG PO CAPS: 10.0000 mg | ORAL_CAPSULE | Freq: Every day | ORAL | Status: DC

## 2015-06-27 MED ORDER — SUMATRIPTAN SUCCINATE 100 MG PO TABS: ORAL_TABLET | ORAL | Status: DC

## 2015-06-27 NOTE — Addendum Note (Signed)
Addended by: Sheilah MinsFOX, JADA A on: 06/27/2015 09:54 AM   Modules accepted: Orders

## 2015-06-27 NOTE — Patient Instructions (Signed)
Migraine Recommendations: 1.  Start nortriptyline 10mg  at bedtime.  Call in 4 weeks with update and we can adjust dose if needed. 2.  Take sumatriptan 100mg  at earliest onset of headache.  May repeat dose once in 2 hours if needed.  Do not exceed two tablets in 24 hours.  You may take each dose with naproxen 500mg . 3.  Limit use of pain relievers to no more than 2 days out of the week.  These medications include acetaminophen, ibuprofen, triptans and narcotics.  This will help reduce risk of rebound headaches. 4.  Be aware of common food triggers such as processed sweets, processed foods with nitrites (such as deli meat, hot dogs, sausages), foods with MSG, alcohol (such as wine), chocolate, certain cheeses, certain fruits (dried fruits, some citrus fruit), vinegar, diet soda. 4.  Avoid caffeine 5.  Routine exercise 6.  Proper sleep hygiene 7.  Stay adequately hydrated with water 8.  Keep a headache diary. 9.  Maintain proper stress management. 10.  Do not skip meals. 11.  Consider supplements:  Magnesium oxide 400mg  to 600mg  daily, riboflavin 400mg , Coenzyme Q 10 100mg  three times daily 12.  We will get MRI of brain with and without contrast 13.  Follow up in 3 months but CALL IN 4 WEEKS WITH UPDATE

## 2015-06-27 NOTE — Progress Notes (Signed)
Chart forwarded.  

## 2015-06-27 NOTE — Progress Notes (Signed)
NEUROLOGY CONSULTATION NOTE  Ann Smothersawa Westrup MRN: [id number] DOB: Nov 10, 1985  Referring provider: Dr. Jillyn HiddenFulp Primary care provider: Dr. Jillyn HiddenFulp  Reason for consult:  Headache and dizziness  HISTORY OF PRESENT ILLNESS: Ann Frey is a 29 year old right-handed female who presents for headache and dizziness.  History obtained by patient and PCP note.  Onset:  Intermittent for past 3 years. Location:  Temples/forehead bilateral Quality:  pounding Intensity:  10/10 Aura:  no Prodrome:  no Associated symptoms:  Spinning sensation, nausea, phonophobia.  Whitening/fogginess of vision.  No vomiting, diplopia, slurred speech, focal numbness or weakness Duration:  3 days Frequency:  Twice a year but has daily dull headaches Triggers/exacerbating factors:  none Relieving factors:  none Activity:  Goes to hospital when severe Meclizine helps with dizziness Tylenol and ibuprofen ineffective She does take Tylenol several times a week for daily headache. No family history of headache.  No previous history of headache.  PAST MEDICAL HISTORY: Past Medical History  Diagnosis Date  . No pertinent past medical history   . Hypertension     PAST SURGICAL HISTORY: Past Surgical History  Procedure Laterality Date  . Cesarean section    . Cesarean section  05/25/2011    Procedure: CESAREAN SECTION;  Surgeon: Lesly DukesKelly H. Leggett, MD;  Location: [facility];  Service: Gynecology;  Laterality: N/A;  Repeat cesarean section with delivery of baby boy at [id number]. Apgars 6/9.    MEDICATIONS: Current Outpatient Prescriptions on File Prior to Visit  Medication Sig Dispense Refill  . methyldopa (ALDOMET) 500 MG tablet Take 1 tablet (500 mg total) by mouth 2 (two) times daily. 60 tablet 2   No current facility-administered medications on file prior to visit.    ALLERGIES: No Known Allergies  FAMILY HISTORY: Family History  Problem Relation Age of Onset  . Hypertension Father   . Diabetes Mother     SOCIAL  HISTORY: Social History   Social History  . Marital Status: Married    Spouse Name: N/A  . Number of Children: N/A  . Years of Education: N/A   Occupational History  . Not on file.   Social History Main Topics  . Smoking status: Never Smoker   . Smokeless tobacco: Not on file  . Alcohol Use: No  . Drug Use: No  . Sexual Activity: Not Currently   Other Topics Concern  . Not on file   Social History Narrative   H.S graduate.     REVIEW OF SYSTEMS: Constitutional: No fevers, chills, or sweats, no generalized fatigue, change in appetite Eyes: No visual changes, double vision, eye pain Ear, nose and throat: No hearing loss, ear pain, nasal congestion, sore throat Cardiovascular: No chest pain, palpitations Respiratory:  No shortness of breath at rest or with exertion, wheezes GastrointestinaI: No nausea, vomiting, diarrhea, abdominal pain, fecal incontinence Genitourinary:  No dysuria, urinary retention or frequency Musculoskeletal:  No neck pain, back pain Integumentary: No rash, pruritus, skin lesions Neurological: as above Psychiatric: No depression, insomnia, anxiety Endocrine: No palpitations, fatigue, diaphoresis, mood swings, change in appetite, change in weight, increased thirst Hematologic/Lymphatic:  No anemia, purpura, petechiae. Allergic/Immunologic: no itchy/runny eyes, nasal congestion, recent allergic reactions, rashes  PHYSICAL EXAM: Filed Vitals:   06/27/15 0858  BP: 122/74  Pulse: 83   General: No acute distress.  Patient appears well-groomed.   Head:  Normocephalic/atraumatic Eyes:  fundi unremarkable, without vessel changes, exudates, hemorrhages or papilledema. Neck: supple, no paraspinal tenderness, full range of motion Back: No paraspinal tenderness  Heart: regular rate and rhythm Lungs: Clear to auscultation bilaterally. Vascular: No carotid bruits. Neurological Exam: Mental status: alert and oriented to person, place, and time, recent and  remote memory intact, fund of knowledge intact, attention and concentration intact, speech fluent and not dysarthric, language intact. Cranial nerves: CN I: not tested CN II: pupils equal, round and reactive to light, visual fields intact, fundi unremarkable, without vessel changes, exudates, hemorrhages or papilledema. CN III, IV, VI:  full range of motion, no nystagmus, no ptosis CN V: facial sensation intact CN VII: upper and lower face symmetric CN VIII: hearing intact CN IX, X: gag intact, uvula midline CN XI: sternocleidomastoid and trapezius muscles intact CN XII: tongue midline Bulk & Tone: normal, no fasciculations. Motor:  5/5 throughout Sensation: temperature and vibration sensation intact. Deep Tendon Reflexes:  2+ throughout, toes downgoing.  Finger to nose testing:  Without dysmetria.  Heel to shin:  Without dysmetria.  Gait:  Normal station and stride.  Able to turn and tandem walk. Romberg negative.  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  Since she has these daily headaches with severe headache associated with vertigo, which has not previously been worked up, will get MRI of brain 2.  Nortriptyline  at bedtime to address chronic daily headache 3.  Advised to stop tylenol 4.  For acute attack, will prescribe sumatriptan  with naproxen  5.  She is to call in 4 weeks and follow up in 3 months.  Thank you for allowing me to take part in the care of this patient.  Shon Millet, DO  CC: Cain Saupe, MD

## 2015-06-28 ENCOUNTER — Telehealth: Payer: Self-pay

## 2015-06-28 NOTE — Telephone Encounter (Signed)
Patient called about imitrex. Copay is over 100 dollars. Called pharmacy. They did not have her insurance on file. Cost went from 112 to 15 / month. Pt okay with that.

## 2015-07-12 ENCOUNTER — Ambulatory Visit (HOSPITAL_COMMUNITY): Admission: RE | Admit: 2015-07-12 | Payer: BC Managed Care – PPO | Source: Ambulatory Visit

## 2015-08-07 ENCOUNTER — Ambulatory Visit (HOSPITAL_COMMUNITY): Admission: RE | Admit: 2015-08-07 | Payer: BC Managed Care – PPO | Source: Ambulatory Visit

## 2015-11-01 ENCOUNTER — Ambulatory Visit: Payer: BC Managed Care – PPO | Admitting: Neurology

## 2017-07-28 ENCOUNTER — Emergency Department (HOSPITAL_COMMUNITY)
Admission: EM | Admit: 2017-07-28 | Discharge: 2017-07-28 | Disposition: A | Discharge status: Home / Self Care | Payer: BC Managed Care – PPO | Attending: Emergency Medicine | Admitting: Emergency Medicine

## 2017-07-28 ENCOUNTER — Emergency Department (HOSPITAL_COMMUNITY): Payer: BC Managed Care – PPO

## 2017-07-28 ENCOUNTER — Encounter (HOSPITAL_COMMUNITY): Payer: Self-pay | Admitting: Emergency Medicine

## 2017-07-28 DIAGNOSIS — Z79899 Other long term (current) drug therapy: Secondary | ICD-10-CM | POA: Insufficient documentation

## 2017-07-28 DIAGNOSIS — R202 Paresthesia of skin: Secondary | ICD-10-CM | POA: Diagnosis not present

## 2017-07-28 DIAGNOSIS — I10 Essential (primary) hypertension: Secondary | ICD-10-CM | POA: Insufficient documentation

## 2017-07-28 DIAGNOSIS — R42 Dizziness and giddiness: Secondary | ICD-10-CM | POA: Diagnosis not present

## 2017-07-28 DIAGNOSIS — R0602 Shortness of breath: Secondary | ICD-10-CM | POA: Diagnosis not present

## 2017-07-28 LAB — CBC WITH DIFFERENTIAL/PLATELET
Basophils Absolute: 0 10*3/uL (ref 0.0–0.1)
Basophils Relative: 0 %
EOS ABS: 0 10*3/uL (ref 0.0–0.7)
EOS PCT: 0 %
HCT: 37.8 % (ref 36.0–46.0)
Hemoglobin: 12.6 g/dL (ref 12.0–15.0)
Lymphocytes Relative: 28 %
Lymphs Abs: 1.6 10*3/uL (ref 0.7–4.0)
MCH: 26.2 pg (ref 26.0–34.0)
MCHC: 33.3 g/dL (ref 30.0–36.0)
MCV: 78.6 fL (ref 78.0–100.0)
Monocytes Absolute: 0.3 10*3/uL (ref 0.1–1.0)
Monocytes Relative: 6 %
Neutro Abs: 3.8 10*3/uL (ref 1.7–7.7)
Neutrophils Relative %: 66 %
PLATELETS: 242 10*3/uL (ref 150–400)
RBC: 4.81 MIL/uL (ref 3.87–5.11)
RDW: 13.4 % (ref 11.5–15.5)
WBC: 5.7 10*3/uL (ref 4.0–10.5)

## 2017-07-28 LAB — COMPREHENSIVE METABOLIC PANEL
ALT: 16 U/L (ref 14–54)
AST: 21 U/L (ref 15–41)
Albumin: 3.7 g/dL (ref 3.5–5.0)
Alkaline Phosphatase: 57 U/L (ref 38–126)
Anion gap: 11 (ref 5–15)
BUN: 7 mg/dL (ref 6–20)
CHLORIDE: 107 mmol/L (ref 101–111)
CO2: 21 mmol/L — AB (ref 22–32)
CREATININE: 0.88 mg/dL (ref 0.44–1.00)
Calcium: 9 mg/dL (ref 8.9–10.3)
GFR calc non Af Amer: >60 mL/min (ref >60)
Glucose, Bld: 83 mg/dL (ref 65–99)
Potassium: 3.6 mmol/L (ref 3.5–5.1)
SODIUM: 139 mmol/L (ref 135–145)
Total Bilirubin: 0.5 mg/dL (ref 0.3–1.2)
Total Protein: 7.1 g/dL (ref 6.5–8.1)

## 2017-07-28 LAB — D-DIMER, QUANTITATIVE: D-Dimer, Quant: 0.47 ug/mL-FEU (ref 0.00–0.50)

## 2017-07-28 LAB — URINALYSIS, ROUTINE W REFLEX MICROSCOPIC
BILIRUBIN URINE: NEGATIVE
Glucose, UA: NEGATIVE mg/dL
Hgb urine dipstick: NEGATIVE
Ketones, ur: 5 mg/dL — AB
Leukocytes, UA: NEGATIVE
Nitrite: NEGATIVE
Protein, ur: NEGATIVE mg/dL
SPECIFIC GRAVITY, URINE: 1.004 — AB (ref 1.005–1.030)
pH: 6 (ref 5.0–8.0)

## 2017-07-28 LAB — I-STAT BETA HCG BLOOD, ED (MC, WL, AP ONLY)

## 2017-07-28 NOTE — ED Triage Notes (Signed)
[facility] and she states sudden onset of SOB. Tacy at 105. EKG unremarkable. A&Ox4; EMS applied 2 L [place]. Room air sats was 80's but was hyperventilating.

## 2017-07-28 NOTE — ED Notes (Signed)
Pt does report she feels better.

## 2017-07-28 NOTE — ED Notes (Signed)
ED Provider at bedside. 

## 2017-07-28 NOTE — ED Notes (Signed)
Pt to xray

## 2017-07-28 NOTE — ED Provider Notes (Signed)
[facility] EMERGENCY DEPARTMENT Provider Note   CSN: [id number] Arrival date & time: 07/28/17  4098     History   Chief Complaint Chief Complaint  Patient presents with  . Shortness of Breath  . Near Syncope    HPI Ann Frey is a 32 y.o. female.  HPI  The patient is a 32 year old female, she reports being otherwise healthy though according to the medical record she has been diagnosed with severe preeclampsia during pregnancy as well as vestibular migraines.  The patient does report that she has had intermittent episodes of dizziness over the last couple of years which she cannot explain but states that she has this feeling that the room is spinning that will occur at different times whether she is standing sitting changing position or resting.  She reports that she was in her usual state of health this morning, she drove to work, she was able to eat breakfast, ambulate without difficulty but while she was sitting in a chair talking to her husband on the phone she had acute onset of shortness of breath, dizziness and vertigo as well as tingling in her bilateral hands and feet feeling like she could not feel either of her legs.  She sat on the ground and called a coworker for help.  When the paramedics were called they found the patient to be hyperventilating, she apparently had a low oxygen level however this is totally resolved by the time of arrival.  She states that she feels better at this time, while she is resting in the bed she has no symptoms.  She denies any focality of her symptoms including unilateral or lateralizing weakness numbness or discoordination.  She has had no difficulty with speech.  She does report that she had bilateral blurred vision during this episode today but that has since resolved as well.  She does take birth control tablets but takes no other over-the-counter medications.  She has had no head injuries, no recent travel trauma immobilization  surgery and does not smoke cigarettes.  She has no other risk factors for pulmonary embolism.  Past Medical History:  Diagnosis Date  . Hypertension   . No pertinent past medical history     Patient Active Problem List   Diagnosis Date Noted  . 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 Severe preeclampsia 05/26/2011  . History of cesarean section 04/28/2011  . Transient hypertension of pregnancy, antepartum 04/01/2011  . Supervision of high-risk pregnancy 03/03/2011    Past Surgical History:  Procedure Laterality Date  . CESAREAN SECTION    . CESAREAN SECTION  05/25/2011   Procedure: CESAREAN SECTION;  Surgeon: Lesly Dukes, MD;  Location: [facility];  Service: Gynecology;  Laterality: N/A;  Repeat cesarean section with delivery of baby boy at 67. Apgars 6/9.    OB History    Gravida Para Term Preterm AB Living   2 2 2  0 0 2   SAB TAB Ectopic Multiple Live Births   0 0 0 0 2       Home Medications    Prior to Admission medications   Medication Sig Start Date End Date Taking? Authorizing Provider  naproxen (NAPROSYN) 500 MG tablet Take 1 tablet (500 mg total) by mouth every 12 (twelve) hours as needed. 06/27/15  Yes Jaffe, Adam R, DO  norethindrone-ethinyl estradiol (MICROGESTIN,JUNEL,LOESTRIN) 1-20 MG-MCG tablet Take 1 tablet by mouth at bedtime.   Yes [provider]  methyldopa (ALDOMET) 500 MG tablet Take 1 tablet (500  mg total) by mouth 2 (two) times daily. 04/08/11 04/07/12  Anyanwu, Jethro BastosUgonna A, MD  nortriptyline (PAMELOR) 10 MG capsule Take 1 capsule (10 mg total) by mouth at bedtime. Patient not taking: Reported on 07/28/2017 06/27/15   Drema DallasJaffe, Adam R, DO  SUMAtriptan (IMITREX) 100 MG tablet Take 1 tab at earliest onset of headache.  May repeat once in 2 hours if headache persists or recurs.  Do not exceed 2 tablets in 24 hours Patient not taking: Reported on 07/28/2017 06/27/15   Drema DallasJaffe, Adam R, DO    Family History Family History  Problem Relation Age of Onset  .  Hypertension Father   . Diabetes Mother     Social History Social History   Tobacco Use  . Smoking status: Never Smoker  Substance Use Topics  . Alcohol use: No  . Drug use: No     Allergies   Patient has no known allergies.   Review of Systems Review of Systems  All other systems reviewed and are negative.    Physical Exam Updated Vital Signs BP 122/71   Pulse 86   Temp 97.9 F (36.6 C) (Oral)   Resp 17   LMP 07/08/2017 (Approximate)   SpO2 100%   Physical Exam  Constitutional: She appears well-developed and well-nourished. No distress.  HENT:  Head: Normocephalic and atraumatic.  Mouth/Throat: Oropharynx is clear and moist. No oropharyngeal exudate.  Eyes: Conjunctivae and EOM are normal. Pupils are equal, round, and reactive to light. Right eye exhibits no discharge. Left eye exhibits no discharge. No scleral icterus.  Neck: Normal range of motion. Neck supple. No JVD present. No thyromegaly present.  Cardiovascular: Normal rate, regular rhythm, normal heart sounds and intact distal pulses. Exam reveals no gallop and no friction rub.  No murmur heard. Pulmonary/Chest: Effort normal and breath sounds normal. No respiratory distress. She has no wheezes. She has no rales.  Abdominal: Soft. Bowel sounds are normal. She exhibits no distension and no mass. There is no tenderness.  Musculoskeletal: Normal range of motion. She exhibits no edema or tenderness.  Lymphadenopathy:    She has no cervical adenopathy.  Neurological: She is alert. Coordination normal.  Speech is clear, cranial nerves III through XII are intact, memory is intact, strength is normal in all 4 extremities including grips, sensation is intact to light touch and pinprick in all 4 extremities. Coordination as tested by finger-nose-finger is normal, no limb ataxia. Normal gait, normal reflexes at the patellar tendons bilaterally  Skin: Skin is warm and dry. No rash noted. No erythema.  Psychiatric: She  has a normal mood and affect. Her behavior is normal.  Nursing note and vitals reviewed.    ED Treatments / Results  Labs (all labs ordered are listed, but only abnormal results are displayed) Labs Reviewed  COMPREHENSIVE METABOLIC PANEL - Abnormal; Notable for the following components:      Result Value   CO2 21 (*)    All other components within normal limits  URINALYSIS, ROUTINE W REFLEX MICROSCOPIC - Abnormal; Notable for the following components:   Color, Urine STRAW (*)    Specific Gravity, Urine 1.004 (*)    Ketones, ur 5 (*)    All other components within normal limits  CBC WITH DIFFERENTIAL/PLATELET  D-DIMER, QUANTITATIVE (NOT AT [facility])  I-STAT BETA HCG BLOOD, ED ([facility], [facility], [facility] ONLY)    EKG  EKG Interpretation  Date/Time:  Tuesday July 28 2017 11:25:51 EST Ventricular Rate:  90 PR Interval:  QRS Duration: 74 QT Interval:  364 QTC Calculation: 446 R Axis:   59 Text Interpretation:  Sinus rhythm Probable left atrial enlargement Borderline T abnormalities, anterior leads Since last tracing rate slower Confirmed by Eber Hong ([id number]) on 07/28/2017 12:32:57 PM      Radiology Dg Chest 2 View  Result Date: 07/28/2017 CLINICAL DATA:  Sudden onset shortness of Breath EXAM: CHEST  2 VIEW COMPARISON:  07/05/2013 FINDINGS: The heart size and mediastinal contours are within normal limits. Both lungs are clear. The visualized skeletal structures are unremarkable. IMPRESSION: No active cardiopulmonary disease. Electronically Signed   By: Alcide Clever M.D.   On: 07/28/2017 11:01   Ct Head Wo Contrast  Result Date: 07/28/2017 CLINICAL DATA:  Sudden onset dizziness with near syncope EXAM: CT HEAD WITHOUT CONTRAST TECHNIQUE: Contiguous axial images were obtained from the base of the skull through the vertex without intravenous contrast. COMPARISON:  No comparison studies available. FINDINGS: Brain: There is no evidence for acute hemorrhage, hydrocephalus, mass lesion, or abnormal  extra-axial fluid collection. No definite CT evidence for acute infarction. Vascular: No hyperdense vessel or unexpected calcification. Skull: No evidence for fracture. No worrisome lytic or sclerotic lesion. Sinuses/Orbits: The visualized paranasal sinuses and mastoid air cells are clear. Visualized portions of the globes and intraorbital fat are unremarkable. Other: None. IMPRESSION: Normal exam. Electronically Signed   By: Kennith Center M.D.   On: 07/28/2017 11:28    Procedures Procedures (including critical care time)  Medications Ordered in ED Medications - No data to display   Initial Impression / Assessment and Plan / ED Course  I have reviewed the triage vital signs and the nursing notes.  Pertinent labs & imaging results that were available during my care of the patient were reviewed by me and considered in my medical decision making (see chart for details).    Normal neuro exam.   The patient has had subacute symptoms for now for a couple of years of the vertigo, she does not have them actively at this time however when I try to sit the patient up to perform some neurologic exam she states that she has a feeling like she is going to fall.  That being said her neurologic exam is normal and there is no sensory deficits anywhere, her cranial nerves III through XII are normal, her coordination and strength is normal in all 4 extremities, her speech is clear and she states that her visual acuity is normal.  Her cardiac exam is also unremarkable however will check labs, EKG, chest x-ray, d-dimer to rule out pulmonary embolism as she would be considered a low risk candidate.  She will also benefit from a CT scan of the brain to make sure there is no signs of focal lesions which could be causing her intermittent vertigo.  Labs reviewed, no acute findings, patient informed of all these results, she appears stable for discharge after metabolic panel  Metabolic panel unremarkable, the patient  was informed of all of her results, given a copy of her results to follow-up with her family doctor.  Expressed understanding to the indications for return  Final Clinical Impressions(s) / ED Diagnoses   Final diagnoses:  Paresthesias  Vertigo  Shortness of breath      Eber Hong, MD 07/28/17 1243

## 2017-07-28 NOTE — Discharge Instructions (Signed)
You have had normal testing, please see the attached results which I have stapled to your paperwork.  You should follow-up with your family doctor within the next 48-72 hours.  Please return to the emergency department for any severe or worsening swelling, pain, difficulty breathing, vertigo, headache or fevers.

## 2017-07-28 NOTE — ED Notes (Addendum)
RN called to check on cmp.

## 2017-08-11 ENCOUNTER — Ambulatory Visit: Payer: BC Managed Care – PPO | Admitting: Neurology

## 2017-08-11 ENCOUNTER — Encounter: Payer: Self-pay | Admitting: Neurology

## 2017-08-11 VITALS — BP 150/86 | HR 115 | Ht 63.0 in | Wt 204.4 lb

## 2017-08-11 DIAGNOSIS — R531 Weakness: Secondary | ICD-10-CM

## 2017-08-11 DIAGNOSIS — G43109 Migraine with aura, not intractable, without status migrainosus: Secondary | ICD-10-CM | POA: Diagnosis not present

## 2017-08-11 DIAGNOSIS — I10 Essential (primary) hypertension: Secondary | ICD-10-CM

## 2017-08-11 DIAGNOSIS — G43809 Other migraine, not intractable, without status migrainosus: Secondary | ICD-10-CM

## 2017-08-11 DIAGNOSIS — R208 Other disturbances of skin sensation: Secondary | ICD-10-CM

## 2017-08-11 MED ORDER — NAPROXEN SODIUM 550 MG PO TABS: 550.0000 mg | ORAL_TABLET | Freq: Two times a day (BID) | ORAL | 2 refills | Status: DC | PRN

## 2017-08-11 NOTE — Patient Instructions (Addendum)
Likely, you had a severe migraine again.  Due to the persistent symptoms, we will get an MRI of the brain with and without contrast.  In the meantime, continue nortriptyline 10mg  at bedtime.  When you need a refill, contact me if you think we need to increase dose.  If you get a headache, take naproxen 550mg  every 12 hours as needed, but limit to no more than 2 days out of the week to prevent rebound headache  Follow up with PCP concerning the elevated blood pressure.  Follow up in 3 months.

## 2017-08-11 NOTE — Progress Notes (Signed)
NEUROLOGY FOLLOW UP OFFICE NOTE  Ann Frey [id number]  HISTORY OF PRESENT ILLNESS: Ann Frey is a 32 year old right-handed female who follows up for migraine and paresthesias.  UPDATE: She was last seen on 06/27/15.  At that time, I prescribed her nortriptyline 10mg  at bedtime for preventative treatment and sumatriptan 100mg  with naproxen 500mg  for abortive treatment.  She never took the sumatriptan and naproxen, however she did start nortriptyline and migraines resolved.  She stopped taking it over a year ago.  She had been doing well until 07/28/17.  She developed sudden tingling of the entire upper and lower face which then involved the entire body with burning and redness in the hands.  She had associated generalized weakness, spinning sensation, nausea, shortness of breath and visual disturbance described as blurred vision with dimming of vision.  She denied headache at the time.  Head CT was personally reviewed and was unremarkable.  EKG and labs were unremarkable.  Since then, she continues to feel generalized weakness, dysesthesias of the face and entire body/extremities and lightheadedness.  She has a mild headache which started yesterday.  She took 2 Aleve with no improvement.  She is anxious. Blood pressure and pulse are elevated.  PAST MEDICAL HISTORY: Past Medical History:  Diagnosis Date  . Hypertension   . No pertinent past medical history     MEDICATIONS: Current Outpatient Medications on File Prior to Visit  Medication Sig Dispense Refill  . meclizine (ANTIVERT) 25 MG tablet Take 25 mg by mouth as needed.    . methyldopa (ALDOMET) 500 MG tablet Take 1 tablet (500 mg total) by mouth 2 (two) times daily. 60 tablet 2  . naproxen (NAPROSYN) 500 MG tablet Take 1 tablet (500 mg total) by mouth every 12 (twelve) hours as needed. (Patient not taking: Reported on 08/11/2017) 16 tablet 2  . norethindrone-ethinyl estradiol (MICROGESTIN,JUNEL,LOESTRIN) 1-20 MG-MCG tablet Take 1  tablet by mouth at bedtime.    . nortriptyline (PAMELOR) 10 MG capsule Take 1 capsule (10 mg total) by mouth at bedtime. (Patient not taking: Reported on 07/28/2017) 30 capsule 0  . SUMAtriptan (IMITREX) 100 MG tablet Take 1 tab at earliest onset of headache.  May repeat once in 2 hours if headache persists or recurs.  Do not exceed 2 tablets in 24 hours (Patient not taking: Reported on 07/28/2017) 10 tablet 2   No current facility-administered medications on file prior to visit.     ALLERGIES: No Known Allergies  FAMILY HISTORY: Family History  Problem Relation Age of Onset  . Hypertension Father   . Diabetes Mother     SOCIAL HISTORY: Social History   Socioeconomic History  . Marital status: Married    Spouse name: Not on file  . Number of children: Not on file  . Years of education: Not on file  . Highest education level: Not on file  Social Needs  . Financial resource strain: Not on file  . Food insecurity - worry: Not on file  . Food insecurity - inability: Not on file  . Transportation needs - medical: Not on file  . Transportation needs - non-medical: Not on file  Occupational History  . Not on file  Tobacco Use  . Smoking status: Never Smoker  . Smokeless tobacco: Never Used  Substance and Sexual Activity  . Alcohol use: No  . Drug use: No  . Sexual activity: Not Currently  Other Topics Concern  . Not on file  Social History Narrative  H.S graduate.     REVIEW OF SYSTEMS: Constitutional: No fevers, chills, or sweats, no generalized fatigue, change in appetite Eyes: as above Ear, nose and throat: No hearing loss, ear pain, nasal congestion, sore throat Cardiovascular: No chest pain, palpitations Respiratory:  No shortness of breath at rest or with exertion, wheezes GastrointestinaI: No nausea, vomiting, diarrhea, abdominal pain, fecal incontinence Genitourinary:  No dysuria, urinary retention or frequency Musculoskeletal:  No neck pain, back  pain Integumentary: hands sometimes gets red Neurological: as above Psychiatric: No depression.  Anxious Endocrine: No palpitations, fatigue, diaphoresis, mood swings, change in appetite, change in weight, increased thirst Hematologic/Lymphatic:  No purpura, petechiae. Allergic/Immunologic: no itchy/runny eyes, nasal congestion, recent allergic reactions, rashes  PHYSICAL EXAM: Vitals:   08/11/17 0854  BP: (!) 150/86  Pulse: (!) 115  SpO2: 99%   General: No acute distress.  Patient appears well-groomed.  Head:  Normocephalic/atraumatic Eyes:  Fundi examined but not visualized Neck: supple, no paraspinal tenderness, full range of motion Heart:  Regular rate and rhythm Lungs:  Clear to auscultation bilaterally Back: No paraspinal tenderness Neurological Exam: alert and oriented to person, place, and time. Attention span and concentration intact, recent and remote memory intact, fund of knowledge intact.  Speech fluent and not dysarthric, language intact.  CN II-XII intact. Bulk and tone normal, muscle strength 5/5 throughout.  Sensation to light touch, temperature and vibration intact.  Deep tendon reflexes 2+ throughout, toes downgoing.  Finger to nose and heel to shin testing intact.  Gait normal, Romberg negative.  IMPRESSION: Vestibular migraine.  The vertigo was likely migrainous in nature.  The paresthesias and visual disturbance could be new migraine symptoms.  However, since these symptoms persist after 2 weeks, then further workup is warranted. HTN  PLAN: 1.  MRI of brain with and without contrast. 2.  Continue nortriptyline 10mg  at bedtime (restarted by PCP yesterday).  We can increase dose in 4 weeks if needed. 3.  Prescribe naproxen 550mg  for abortive headache therapy.  Limit use to no more than 2 days out of the week to prevent rebound headache. 4.  Follow up with PCP regarding blood pressure 5.  Follow up in 3 months.  Ann MilletAdam Dontavis Tschantz, DO  CC: Sigmund HazelLisa Miller, MD

## 2017-08-17 NOTE — Progress Notes (Signed)
Pt LM on VM stating she has new symptoms. On Fri, she experienced pain throughout her jaw and today she has had CP, she said it feels like if she could "burp' she would feel better. I called and spoke with Pt, per Dr.Jaffe, is not neurological, though could be serious, advsd Pt to be evaluated ASAP at ED. Pt said will go to PCP, they accept walk-ins

## 2017-08-18 ENCOUNTER — Other Ambulatory Visit: Payer: BC Managed Care – PPO

## 2017-08-19 ENCOUNTER — Telehealth: Payer: Self-pay

## 2017-08-19 ENCOUNTER — Ambulatory Visit
Admission: RE | Admit: 2017-08-19 | Discharge: 2017-08-19 | Disposition: A | Discharge status: Home / Self Care | Payer: BC Managed Care – PPO | Source: Ambulatory Visit | Attending: Neurology | Admitting: Neurology

## 2017-08-19 DIAGNOSIS — R208 Other disturbances of skin sensation: Secondary | ICD-10-CM

## 2017-08-19 DIAGNOSIS — R531 Weakness: Secondary | ICD-10-CM

## 2017-08-19 MED ORDER — GADOBENATE DIMEGLUMINE 529 MG/ML IV SOLN
18.0000 mL | Freq: Once | INTRAVENOUS | Status: AC | PRN
  Administered 2017-08-19: 18 mL via INTRAVENOUS

## 2017-08-19 NOTE — Telephone Encounter (Signed)
Called and spoke with Pt, advsd her of MRI result.

## 2017-08-19 NOTE — Telephone Encounter (Signed)
-----   Message from Drema DallasAdam R Jaffe, DO sent at 08/19/2017  2:50 PM EST ----- MRI of brain looks unremarkable.  It does not reveal anything concerning or anything that would explain her symptoms.

## 2017-08-24 ENCOUNTER — Telehealth: Payer: Self-pay | Admitting: Neurology

## 2017-08-24 NOTE — Telephone Encounter (Signed)
Spoke with Pt. She said she read that the symptoms she's having can be side effects from the nortriptyline. Advsd her not to take nortriptyline and I will call her back tomorrow.

## 2017-08-24 NOTE — Telephone Encounter (Signed)
Pt has a question regarding her nortriptyline she is having chest pains and constipation and bad dreams and wonders if it is the medication

## 2017-08-25 MED ORDER — PROPRANOLOL HCL ER 80 MG PO CP24: 80.0000 mg | ORAL_CAPSULE | Freq: Every day | ORAL | 3 refills | Status: DC

## 2017-08-25 NOTE — Telephone Encounter (Signed)
Called and spoke with Pt, advsd of change to propranolol ER 80mg , possible side effects, and to call in 4 wks if not improved.

## 2017-08-25 NOTE — Telephone Encounter (Signed)
Agree to stop nortriptyline.  Instead, we can try propranolol ER 80mg  daily.  It is typically used to lower high blood pressure or elevated heart rate, so she should monitor for lightheadedness.  If headaches not improved in 4 weeks, she should contact us and we can increase dose.

## 2017-09-11 ENCOUNTER — Telehealth: Payer: Self-pay | Admitting: Neurology

## 2017-09-11 MED ORDER — TOPIRAMATE 25 MG PO TABS: 25.0000 mg | ORAL_TABLET | Freq: Every day | ORAL | 0 refills | Status: DC

## 2017-09-11 NOTE — Telephone Encounter (Signed)
Stop propranolol.  Start topiramate 25mg  at bedtime.  We can increase dose in 4 weeks if needed.  It is not recommended to be pregnant while on this medication as it may have effects on baby, so she should take precautions not to get pregnant.  It is a very effective medication for headaches.

## 2017-09-11 NOTE — Telephone Encounter (Signed)
Patient made aware RX sent.  

## 2017-09-11 NOTE — Telephone Encounter (Signed)
Patient called with complaints of "side effects" from her Propranolol.   She states SOB that has been worse the last few days, but going on for weeks. States she has a rash that started a day after she started Propranolol on her chest that hasn't gone away (started meds on 08/26/17). No change in soaps, detergents, etc. She also states her heart "doesn't feel right" she is unable to explain what this means and hasn't checked her heart rate or blood pressure.   Please advise. She can be reached at [phone].

## 2017-09-12 ENCOUNTER — Other Ambulatory Visit: Payer: Self-pay

## 2017-09-12 ENCOUNTER — Emergency Department (HOSPITAL_COMMUNITY)
Admission: EM | Admit: 2017-09-12 | Discharge: 2017-09-12 | Disposition: A | Discharge status: Home / Self Care | Payer: BC Managed Care – PPO | Attending: Emergency Medicine | Admitting: Emergency Medicine

## 2017-09-12 ENCOUNTER — Emergency Department (HOSPITAL_COMMUNITY): Payer: BC Managed Care – PPO

## 2017-09-12 ENCOUNTER — Encounter (HOSPITAL_COMMUNITY): Payer: Self-pay | Admitting: *Deleted

## 2017-09-12 DIAGNOSIS — Z79899 Other long term (current) drug therapy: Secondary | ICD-10-CM | POA: Insufficient documentation

## 2017-09-12 DIAGNOSIS — R0789 Other chest pain: Secondary | ICD-10-CM | POA: Diagnosis not present

## 2017-09-12 DIAGNOSIS — I10 Essential (primary) hypertension: Secondary | ICD-10-CM | POA: Insufficient documentation

## 2017-09-12 DIAGNOSIS — R002 Palpitations: Secondary | ICD-10-CM

## 2017-09-12 LAB — I-STAT TROPONIN, ED: TROPONIN I, POC: 0 ng/mL (ref 0.00–0.08)

## 2017-09-12 LAB — CBC
HCT: 39.6 % (ref 36.0–46.0)
HEMOGLOBIN: 13.3 g/dL (ref 12.0–15.0)
MCH: 26.5 pg (ref 26.0–34.0)
MCHC: 33.6 g/dL (ref 30.0–36.0)
MCV: 79 fL (ref 78.0–100.0)
PLATELETS: 277 10*3/uL (ref 150–400)
RBC: 5.01 MIL/uL (ref 3.87–5.11)
RDW: 13.1 % (ref 11.5–15.5)
WBC: 5.1 10*3/uL (ref 4.0–10.5)

## 2017-09-12 LAB — BASIC METABOLIC PANEL
ANION GAP: 8 (ref 5–15)
BUN: 6 mg/dL (ref 6–20)
CALCIUM: 9.7 mg/dL (ref 8.9–10.3)
CO2: 23 mmol/L (ref 22–32)
Chloride: 106 mmol/L (ref 101–111)
Creatinine, Ser: 0.99 mg/dL (ref 0.44–1.00)
GFR calc non Af Amer: >60 mL/min (ref >60)
Glucose, Bld: 141 mg/dL — ABNORMAL HIGH (ref 65–99)
Potassium: 3.8 mmol/L (ref 3.5–5.1)
SODIUM: 137 mmol/L (ref 135–145)

## 2017-09-12 LAB — D-DIMER, QUANTITATIVE: D-Dimer, Quant: 0.38 ug/mL-FEU (ref 0.00–0.50)

## 2017-09-12 LAB — I-STAT BETA HCG BLOOD, ED (MC, WL, AP ONLY)

## 2017-09-12 NOTE — ED Notes (Signed)
Pt verbalized understanding discharge instructions and denies any further needs or questions at this time. VS stable, ambulatory and steady gait.   

## 2017-09-12 NOTE — ED Provider Notes (Addendum)
[facility] EMERGENCY DEPARTMENT Provider Note   CSN: [id number] Arrival date & time: 09/12/17  1057     History   Chief Complaint Chief Complaint  Patient presents with  . Palpitations    HPI Ann Frey is a 32 y.o. female.  Patient has had palpitations intermittently since August 26, 2017.  However she is felt her heart racing since around 3 AM today.  She is also had a chest tightness constantly since 3 AM today which is improved with lying supine and improved with raising her arms above her head.  Nonexertional.  She also admits to mild shortness of breath.  No other associated symptoms.  She was told to stop propranolol yesterday and to start topiramate for migraines as she feels the propranolol caused palpitations and a rash.  No longer has rash.  No other associated symptoms.  Last normal menstrual period September 01, 2017  HPI  Past Medical History:  Diagnosis Date  . Hypertension   . No pertinent past medical history     Patient Active Problem List   Diagnosis Date Noted  . 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 Severe preeclampsia 05/26/2011  . History of cesarean section 04/28/2011  . Transient hypertension of pregnancy, antepartum 04/01/2011  . Supervision of high-risk pregnancy 03/03/2011    Past Surgical History:  Procedure Laterality Date  . CESAREAN SECTION    . CESAREAN SECTION  05/25/2011   Procedure: CESAREAN SECTION;  Surgeon: Lesly Dukes, MD;  Location: [facility];  Service: Gynecology;  Laterality: N/A;  Repeat cesarean section with delivery of baby boy at 18. Apgars 6/9.    OB History    Gravida Para Term Preterm AB Living   2 2 2  0 0 2   SAB TAB Ectopic Multiple Live Births   0 0 0 0 2       Home Medications    Prior to Admission medications   Medication Sig Start Date End Date Taking? Authorizing Provider  levothyroxine (SYNTHROID, LEVOTHROID) 25 MCG tablet Take 25 mcg by mouth every morning.   Yes [provider]  naproxen (NAPROSYN) 500 MG tablet Take 1 tablet (500 mg total) by mouth every 12 (twelve) hours as needed. Patient taking differently: Take 550 mg by mouth every 12 (twelve) hours as needed for mild pain.  06/27/15  Yes Jaffe, Adam R, DO  naproxen sodium (ANAPROX) 550 MG tablet Take 1 tablet (550 mg total) by mouth every 12 (twelve) hours as needed. 08/11/17  Yes Jaffe, Adam R, DO  norethindrone-ethinyl estradiol (MICROGESTIN,JUNEL,LOESTRIN) 1-20 MG-MCG tablet Take 1 tablet by mouth at bedtime.   Yes [provider]  PROAIR HFA 108 [phone] Base) MCG/ACT inhaler Inhale 2 puffs into the lungs every 4 (four) hours as needed for shortness of breath. 08/27/17  Yes [provider]  topiramate (TOPAMAX) 25 MG tablet Take 1 tablet (25 mg total) by mouth daily. 09/11/17  Yes Jaffe, Adam R, DO  methyldopa (ALDOMET) 500 MG tablet Take 1 tablet (500 mg total) by mouth 2 (two) times daily. 04/08/11 04/07/12  Anyanwu, Jethro Bastos, MD  nortriptyline (PAMELOR) 10 MG capsule Take 1 capsule (10 mg total) by mouth at bedtime. Patient not taking: Reported on 07/28/2017 06/27/15   Drema Dallas, DO  SUMAtriptan (IMITREX) 100 MG tablet Take 1 tab at earliest onset of headache.  May repeat once in 2 hours if headache persists or recurs.  Do not exceed 2 tablets in 24 hours Patient not taking: Reported  on 07/28/2017 06/27/15   Drema DallasJaffe, Adam R, DO    Family History Family History  Problem Relation Age of Onset  . Hypertension Father   . Diabetes Mother     Social History Social History   Tobacco Use  . Smoking status: Never Smoker  . Smokeless tobacco: Never Used  Substance Use Topics  . Alcohol use: No  . Drug use: No     Allergies   Patient has no known allergies.   Review of Systems Review of Systems  Constitutional: Negative.   HENT: Negative.   Respiratory: Positive for shortness of breath.   Cardiovascular: Positive for chest pain and palpitations.  Gastrointestinal: Negative.    Musculoskeletal: Negative.   Skin: Negative.   Neurological: Negative.   Psychiatric/Behavioral: Negative.   All other systems reviewed and are negative.    Physical Exam Updated Vital Signs BP (!) 144/85   Pulse (!) 102   Temp 98.6 F (37 C) (Oral)   Resp 17   Ht 5\' 2"  (1.575 m)   Wt 92.5 kg (204 lb)   SpO2 100%   BMI 37.31 kg/m   Physical Exam  Constitutional: She appears well-developed and well-nourished.  HENT:  Head: Normocephalic and atraumatic.  Eyes: Conjunctivae are normal. Pupils are equal, round, and reactive to light.  Neck: Neck supple. No tracheal deviation present. No thyromegaly present.  Cardiovascular: Regular rhythm.  No murmur heard. Mildly tachycardic  Pulmonary/Chest: Effort normal and breath sounds normal.  Abdominal: Soft. Bowel sounds are normal. She exhibits no distension. There is no tenderness.  Musculoskeletal: Normal range of motion. She exhibits no edema or tenderness.  Neurological: She is alert. Coordination normal.  Skin: Skin is warm and dry. No rash noted.  Psychiatric: She has a normal mood and affect.  Nursing note and vitals reviewed.    ED Treatments / Results  Labs (all labs ordered are listed, but only abnormal results are displayed) Labs Reviewed  BASIC METABOLIC PANEL - Abnormal; Notable for the following components:      Result Value   Glucose, Bld 141 (*)    All other components within normal limits  CBC  D-DIMER, QUANTITATIVE (NOT AT [facility])  I-STAT TROPONIN, ED  I-STAT BETA HCG BLOOD, ED ([facility], [facility], [facility] ONLY)    EKG  EKG Interpretation  Date/Time:  Saturday September 12 2017 11:07:39 EST Ventricular Rate:  110 PR Interval:  172 QRS Duration: 62 QT Interval:  276 QTC Calculation: 373 R Axis:   90 Text Interpretation:  Sinus tachycardia Biatrial enlargement Rightward axis Nonspecific T wave abnormality Abnormal ECG SINCE LAST TRACING HEART RATE HAS INCREASED Confirmed by Doug SouJacubowitz, Yomayra Tate [phone]([id number]) on 09/12/2017 12:12:14  PM       Radiology Dg Chest 2 View  Result Date: 09/12/2017 CLINICAL DATA:  Palpitations EXAM: CHEST - 2 VIEW COMPARISON:  07/28/2017 FINDINGS: Heart and mediastinal contours are within normal limits. No focal opacities or effusions. No acute bony abnormality. IMPRESSION: No active cardiopulmonary disease. Electronically Signed   By: Charlett NoseKevin  Dover M.D.   On: 09/12/2017 11:24   Chest x-ray viewed by me Procedures Procedures (including critical care time)  Medications Ordered in ED Medications - No data to display  Results for orders placed or performed during the hospital encounter of 09/12/17  Basic metabolic panel  Result Value Ref Range   Sodium 137 135 - 145 mmol/L   Potassium 3.8 3.5 - 5.1 mmol/L   Chloride 106 101 - 111 mmol/L   CO2 23 22 -  32 mmol/L   Glucose, Bld 141 (H) 65 - 99 mg/dL   BUN 6 6 - 20 mg/dL   Creatinine, Ser 2.13 0.44 - 1.00 mg/dL   Calcium 9.7 8.9 - 08.6 mg/dL   GFR calc non Af Amer >60 >60 mL/min   GFR calc Af Amer >60 >60 mL/min   Anion gap 8 5 - 15  CBC  Result Value Ref Range   WBC 5.1 4.0 - 10.5 K/uL   RBC 5.01 3.87 - 5.11 MIL/uL   Hemoglobin 13.3 12.0 - 15.0 g/dL   HCT 57.8 46.9 - 62.9 %   MCV 79.0 78.0 - 100.0 fL   MCH 26.5 26.0 - 34.0 pg   MCHC 33.6 30.0 - 36.0 g/dL   RDW 52.8 41.3 - 24.4 %   Platelets 277 150 - 400 K/uL  D-dimer, quantitative (not at [facility])  Result Value Ref Range   D-Dimer, Quant 0.38 0.00 - 0.50 ug/mL-FEU  I-stat troponin, ED  Result Value Ref Range   Troponin i, poc 0.00 0.00 - 0.08 ng/mL   Comment 3          I-Stat beta hCG blood, ED  Result Value Ref Range   I-stat hCG, quantitative <5.0 <5 mIU/mL   Comment 3          Chest x-ray viewed by me Dg Chest 2 View  Result Date: 09/12/2017 CLINICAL DATA:  Palpitations EXAM: CHEST - 2 VIEW COMPARISON:  07/28/2017 FINDINGS: Heart and mediastinal contours are within normal limits. No focal opacities or effusions. No acute bony abnormality. IMPRESSION: No active  cardiopulmonary disease. Electronically Signed   By: Charlett Nose M.D.   On: 09/12/2017 11:24   Mr Laqueta Jean WN Contrast  Result Date: 08/19/2017 CLINICAL DATA:  Dysesthesia both sides of body and face, generalized weakness EXAM: MRI HEAD WITHOUT AND WITH CONTRAST TECHNIQUE: Multiplanar, multiecho pulse sequences of the brain and surrounding structures were obtained without and with intravenous contrast. CONTRAST:  18mL MULTIHANCE GADOBENATE DIMEGLUMINE 529 MG/ML IV SOLN COMPARISON:  CT head 07/28/2017 FINDINGS: Brain: No acute infarction, hemorrhage, hydrocephalus, extra-axial collection or mass lesion. Mildly prominent perivascular spaces throughout the cerebral white matter bilaterally. Basal ganglia and brainstem intact. Several small subcortical white matter hyperintensities bilaterally. Vascular: Normal arterial flow voids. Enhancing branching vascular structure right frontal parietal lobe compatible with developmental venous anomaly draining to the right ventricle. Skull and upper cervical spine: Negative Sinuses/Orbits: Negative Other: None IMPRESSION: No acute intracranial abnormality. Scattered small subcortical white matter hyperintensities appear chronic and are nonspecific. Possible chronic ischemia, complicated migraine headaches, vasculitis. Pattern not typical for demyelinating disease. 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 Electronically Signed   By: Marlan Palau M.D.   On: 08/19/2017 10:08   Initial Impression / Assessment and Plan / ED Course  I have reviewed the triage vital signs and the nursing notes.  Pertinent labs & imaging results that were available during my care of the patient were reviewed by me and considered in my medical decision making (see chart for details).    2:25 PM heart rate has normalized.  She continues to complain of mild anterior chest pain. Heart score equals 2.  Low pretest clinical probability for pulmonary embolism.  Negative d-dimer.   Plan follow-up with Dr.Jaffe in 2 days  Final Clinical Impressions(s) / ED Diagnoses  Diagnoses #1 palpitations #2 atypical chest pain Final diagnoses:  None    ED Discharge Orders    None  Doug Sou, MD 09/12/17 1423    Doug Sou, MD 09/12/17 [phone]

## 2017-09-12 NOTE — ED Triage Notes (Signed)
Pt in c/o palpitations that started on Thursday, states she recently started on propranolol, called her cardiologist and they stopped her propranolol yesterday, this morning palpations feel worse, denies pain

## 2017-09-12 NOTE — Discharge Instructions (Signed)
Contact Dr. Everlena CooperJaffe on Monday, 09/14/2017 to tell him that you are here in the emergency department today.  He may want to adjust your medicines or see you in the office.

## 2017-09-14 ENCOUNTER — Telehealth: Payer: Self-pay | Admitting: Neurology

## 2017-09-14 NOTE — Telephone Encounter (Signed)
SPOKE WITH PATIENT AND SHE IS NEEDING TO SEE HAR PCP DUE TO FAST HR. SHE WAS MISTAKEN ON SEE NEUROLOGIST AND BECAUSE DR. Everlena CooperJAFFE PRESCRIBED PROPANOLOL SHE NEEDED TO FOLLOW UP. PROPANOLOL WAS D/C DUE TO SIDE EFFECTS AND PATIENT WANTED TO MAKE AWARE AFTER ER VISIT SHE STARTED TAKING IT AGAIN. I ADVISE PATIENT TO SEE PCP DUE TO IT WAS HR RELATED ISSUES AND NOT NEUROLOGICAL. PATIENT IS UNDERSTANDING AND AWARE AND WILL CONTACT PCP.

## 2017-09-14 NOTE — Telephone Encounter (Signed)
Patient called to let Dr. Everlena CooperJaffe know that she was seen in the ED this weekend for chest palpations. She was told to follow up with Dr. Everlena CooperJaffe. She needed to know if her medications should be checked? Please Call. Thanks

## 2017-09-14 NOTE — Telephone Encounter (Signed)
Please call.

## 2017-09-14 NOTE — Telephone Encounter (Signed)
The ED doctor is mistaken.  She needs to follow up with a PCP.  The reason she was seen in the ED was for palpitations and elevated heart rate.

## 2017-09-14 NOTE — Telephone Encounter (Signed)
ER notes do say follow up with Dr. Everlena CooperJaffe in two days.   Dr. Everlena CooperJaffe - please review and advise.

## 2017-09-27 ENCOUNTER — Encounter (HOSPITAL_COMMUNITY): Payer: Self-pay | Admitting: Emergency Medicine

## 2017-09-27 ENCOUNTER — Emergency Department (HOSPITAL_COMMUNITY): Payer: BC Managed Care – PPO

## 2017-09-27 ENCOUNTER — Other Ambulatory Visit: Payer: Self-pay

## 2017-09-27 DIAGNOSIS — I10 Essential (primary) hypertension: Secondary | ICD-10-CM | POA: Insufficient documentation

## 2017-09-27 DIAGNOSIS — Z79899 Other long term (current) drug therapy: Secondary | ICD-10-CM | POA: Diagnosis not present

## 2017-09-27 DIAGNOSIS — R002 Palpitations: Secondary | ICD-10-CM | POA: Diagnosis not present

## 2017-09-27 DIAGNOSIS — R0789 Other chest pain: Secondary | ICD-10-CM | POA: Diagnosis not present

## 2017-09-27 DIAGNOSIS — R0602 Shortness of breath: Secondary | ICD-10-CM | POA: Diagnosis not present

## 2017-09-27 LAB — BASIC METABOLIC PANEL
ANION GAP: 9 (ref 5–15)
BUN: 10 mg/dL (ref 6–20)
CHLORIDE: 102 mmol/L (ref 101–111)
CO2: 27 mmol/L (ref 22–32)
CREATININE: 0.87 mg/dL (ref 0.44–1.00)
Calcium: 9.7 mg/dL (ref 8.9–10.3)
GFR calc non Af Amer: >60 mL/min (ref >60)
Glucose, Bld: 118 mg/dL — ABNORMAL HIGH (ref 65–99)
POTASSIUM: 4 mmol/L (ref 3.5–5.1)
SODIUM: 138 mmol/L (ref 135–145)

## 2017-09-27 LAB — CBC
HEMATOCRIT: 37.7 % (ref 36.0–46.0)
HEMOGLOBIN: 12.4 g/dL (ref 12.0–15.0)
MCH: 26.3 pg (ref 26.0–34.0)
MCHC: 32.9 g/dL (ref 30.0–36.0)
MCV: 79.9 fL (ref 78.0–100.0)
Platelets: 291 10*3/uL (ref 150–400)
RBC: 4.72 MIL/uL (ref 3.87–5.11)
RDW: 13.2 % (ref 11.5–15.5)
WBC: 7.5 10*3/uL (ref 4.0–10.5)

## 2017-09-27 LAB — I-STAT BETA HCG BLOOD, ED (MC, WL, AP ONLY)

## 2017-09-27 LAB — I-STAT TROPONIN, ED: Troponin i, poc: 0 ng/mL (ref 0.00–0.08)

## 2017-09-27 NOTE — ED Triage Notes (Signed)
Pt reports left sided chest pain that moves into her left arm.  At the same time she is experiencing this she becomes SOB and very weak.  Reports a "funny feeling" that she is unable to describe.  Has first appointment w/ "heart doctor" on Tuesday.

## 2017-09-28 ENCOUNTER — Emergency Department (HOSPITAL_COMMUNITY)
Admission: EM | Admit: 2017-09-28 | Discharge: 2017-09-28 | Disposition: A | Discharge status: Home / Self Care | Payer: BC Managed Care – PPO | Attending: Emergency Medicine | Admitting: Emergency Medicine

## 2017-09-28 DIAGNOSIS — R079 Chest pain, unspecified: Secondary | ICD-10-CM

## 2017-09-28 NOTE — Discharge Instructions (Addendum)
Your labs and EKG do not show any abnormalities. You can be discharged home and should keep your appointment with the cardiologist tomorrow.

## 2017-09-28 NOTE — ED Provider Notes (Signed)
[facility]GENC[facility] Provider Note   CSN: [id number] Arrival date & time: 09/27/17  2155     History   Chief Complaint Chief Complaint  Patient presents with  . Chest Pain  . Shortness of Breath    HPI Ann Frey is a 32 y.o. female.  Patient here for evaluation of chest pain persistent for the past several weeks, intermittent discomfort described as onset after skipping heart beats, is sharp followed by "a warm sensation" into her throat and abdomen. The pain radiates into the left arm. She has SOB. The symptoms last for a few minutes then subside and can be gone for a period of hours. She has had palpitations in the past, last seen in the ED on 09/12/17, but these symptoms are unrelated to palpation episodes. No nausea, vomiting. She has been referred to cardiology and has first appointment tomorrow.   The history is provided by the patient. No language interpreter was used.  Chest Pain   Associated symptoms include shortness of breath. Pertinent negatives include no fever and no vomiting.  Shortness of Breath  Associated symptoms include chest pain. Pertinent negatives include no fever and no vomiting.    Past Medical History:  Diagnosis Date  . Hypertension   . No pertinent past medical history     Patient Active Problem List   Diagnosis Date Noted  . 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 Severe preeclampsia 05/26/2011  . History of cesarean section 04/28/2011  . Transient hypertension of pregnancy, antepartum 04/01/2011  . Supervision of high-risk pregnancy 03/03/2011    Past Surgical History:  Procedure Laterality Date  . CESAREAN SECTION    . CESAREAN SECTION  05/25/2011   Procedure: CESAREAN SECTION;  Surgeon: Lesly DukesKelly H. Leggett, MD;  Location: [facility];  Service: Gynecology;  Laterality: N/A;  Repeat cesarean section with delivery of baby boy at [id number]. Apgars 6/9.     OB History    Gravida  2   Para  2   Term  2   Preterm  0   AB  0   Living  2     SAB  0   TAB  0   Ectopic  0   Multiple  0   Live Births  2            Home Medications    Prior to Admission medications   Medication Sig Start Date End Date Taking? Authorizing Provider  levothyroxine (SYNTHROID, LEVOTHROID) 25 MCG tablet Take 25 mcg by mouth every morning.   Yes [provider]  naproxen sodium (ANAPROX) 550 MG tablet Take 1 tablet (550 mg total) by mouth every 12 (twelve) hours as needed. 08/11/17  Yes Jaffe, Adam R, DO  norethindrone-ethinyl estradiol (MICROGESTIN,JUNEL,LOESTRIN) 1-20 MG-MCG tablet Take 1 tablet by mouth at bedtime.   Yes [provider]  propranolol ER (INDERAL LA) 80 MG 24 hr capsule Take 80 mg by mouth daily. 09/22/17  Yes [provider]  methyldopa (ALDOMET) 500 MG tablet Take 1 tablet (500 mg total) by mouth 2 (two) times daily. Patient not taking: Reported on 09/28/2017 04/08/11 09/28/24  Tereso NewcomerAnyanwu, Ugonna A, MD  naproxen (NAPROSYN) 500 MG tablet Take 1 tablet (500 mg total) by mouth every 12 (twelve) hours as needed. Patient not taking: Reported on 09/28/2017 06/27/15   Drema DallasJaffe, Adam R, DO  nortriptyline (PAMELOR) 10 MG capsule Take 1 capsule (10 mg total) by mouth at bedtime. Patient not taking: Reported on 07/28/2017 06/27/15  Shon Millet R, DO  SUMAtriptan (IMITREX) 100 MG tablet Take 1 tab at earliest onset of headache.  May repeat once in 2 hours if headache persists or recurs.  Do not exceed 2 tablets in 24 hours Patient not taking: Reported on 07/28/2017 06/27/15   Drema Dallas, DO  topiramate (TOPAMAX) 25 MG tablet Take 1 tablet (25 mg total) by mouth daily. Patient not taking: Reported on 09/28/2017 09/11/17   Drema Dallas, DO    Family History Family History  Problem Relation Age of Onset  . Hypertension Father   . Diabetes Mother     Social History Social History   Tobacco Use  . Smoking status: Never Smoker  . Smokeless tobacco: Never Used  Substance Use Topics  .  Alcohol use: No  . Drug use: No     Allergies   Patient has no known allergies.   Review of Systems Review of Systems  Constitutional: Negative for chills and fever.  Respiratory: Positive for shortness of breath.   Cardiovascular: Positive for chest pain.  Gastrointestinal: Negative.  Negative for vomiting.  Musculoskeletal: Negative.   Skin: Negative.   Neurological: Negative.      Physical Exam Updated Vital Signs BP 131/80   Pulse 75   Temp 98.2 F (36.8 C) (Oral)   Resp 18   Ht 5\' 3"  (1.6 m)   Wt 90.7 kg (200 lb)   LMP 09/02/2017 (Approximate) Comment: pt shielded  SpO2 100%   BMI 35.43 kg/m   Physical Exam  Constitutional: She is oriented to person, place, and time. She appears well-developed and well-nourished.  HENT:  Head: Normocephalic.  Neck: Normal range of motion. Neck supple.  Cardiovascular: Normal rate and regular rhythm.  No murmur heard. Pulmonary/Chest: Effort normal and breath sounds normal. She has no wheezes. She has no rhonchi. She has no rales.  Abdominal: Soft. Bowel sounds are normal. There is no tenderness. There is no rebound and no guarding.  Musculoskeletal: Normal range of motion.       Right lower leg: She exhibits no edema.       Left lower leg: She exhibits no edema.  Neurological: She is alert and oriented to person, place, and time.  Skin: Skin is warm and dry. No rash noted.  Psychiatric: She has a normal mood and affect.     ED Treatments / Results  Labs (all labs ordered are listed, but only abnormal results are displayed) Labs Reviewed  BASIC METABOLIC PANEL - Abnormal; Notable for the following components:      Result Value   Glucose, Bld 118 (*)    All other components within normal limits  CBC  I-STAT TROPONIN, ED  I-STAT BETA HCG BLOOD, ED ([facility], [facility], [facility] ONLY)   Results for orders placed or performed during the hospital encounter of 09/28/17  Basic metabolic panel  Result Value Ref Range   Sodium 138 135 -  145 mmol/L   Potassium 4.0 3.5 - 5.1 mmol/L   Chloride 102 101 - 111 mmol/L   CO2 27 22 - 32 mmol/L   Glucose, Bld 118 (H) 65 - 99 mg/dL   BUN 10 6 - 20 mg/dL   Creatinine, Ser 1.61 0.44 - 1.00 mg/dL   Calcium 9.7 8.9 - 09.6 mg/dL   GFR calc non Af Amer >60 >60 mL/min   GFR calc Af Amer >60 >60 mL/min   Anion gap 9 5 - 15  CBC  Result Value Ref Range  WBC 7.5 4.0 - 10.5 K/uL   RBC 4.72 3.87 - 5.11 MIL/uL   Hemoglobin 12.4 12.0 - 15.0 g/dL   HCT 16.1 09.6 - 04.5 %   MCV 79.9 78.0 - 100.0 fL   MCH 26.3 26.0 - 34.0 pg   MCHC 32.9 30.0 - 36.0 g/dL   RDW 40.9 81.1 - 91.4 %   Platelets 291 150 - 400 K/uL  I-stat troponin, ED  Result Value Ref Range   Troponin i, poc 0.00 0.00 - 0.08 ng/mL   Comment 3          I-Stat beta hCG blood, ED  Result Value Ref Range   I-stat hCG, quantitative <5.0 <5 mIU/mL   Comment 3            EKG EKG Interpretation  Date/Time:  Sunday September 27 2017 22:03:55 EDT Ventricular Rate:  82 PR Interval:  166 QRS Duration: 66 QT Interval:  364 QTC Calculation: 425 R Axis:   84 Text Interpretation:  Normal sinus rhythm Nonspecific T wave abnormality Abnormal ECG No acute changes Nonspecific ST and T wave abnormality Confirmed by Derwood Kaplan ([id number]) on 09/28/2017 2:49:06 AM   Radiology Dg Chest 2 View  Result Date: 09/27/2017 CLINICAL DATA:  32 year old female with chest pain and shortness of breath. EXAM: CHEST - 2 VIEW COMPARISON:  Chest radiograph dated 09/12/2017 FINDINGS: The heart size and mediastinal contours are within normal limits. Both lungs are clear. The visualized skeletal structures are unremarkable. IMPRESSION: No active cardiopulmonary disease. Electronically Signed   By: Elgie Collard M.D.   On: 09/27/2017 22:53    Procedures Procedures (including critical care time)  Medications Ordered in ED Medications - No data to display   Initial Impression / Assessment and Plan / ED Course  I have reviewed the triage vital signs  and the nursing notes.  Pertinent labs & imaging results that were available during my care of the patient were reviewed by me and considered in my medical decision making (see chart for details).     Patient presents with chest pain, skipping beats, SOB x weeks. Labs, EKG and CXR are negative, including troponin. She has an appointment with cardiology tomorrow and is pain free currently.   She can be discharged home and encouraged to keep the appointment with cardiology.  Final Clinical Impressions(s) / ED Diagnoses   Final diagnoses:  None   1. Nonspecific chest pain  ED Discharge Orders    None       Elpidio Anis, PA-C 09/28/17 0402    Derwood Kaplan, MD 09/28/17 2120

## 2017-10-08 ENCOUNTER — Other Ambulatory Visit: Payer: Self-pay | Admitting: Neurology

## 2017-11-02 ENCOUNTER — Ambulatory Visit: Payer: BC Managed Care – PPO | Admitting: Neurology

## 2017-11-07 ENCOUNTER — Other Ambulatory Visit: Payer: Self-pay | Admitting: Neurology

## 2017-11-19 ENCOUNTER — Ambulatory Visit: Payer: BC Managed Care – PPO | Admitting: Neurology

## 2017-11-19 ENCOUNTER — Encounter: Payer: Self-pay | Admitting: Neurology

## 2017-11-19 ENCOUNTER — Encounter

## 2017-11-19 VITALS — BP 130/82 | HR 102 | Ht 63.0 in | Wt 204.0 lb

## 2017-11-19 DIAGNOSIS — G43109 Migraine with aura, not intractable, without status migrainosus: Secondary | ICD-10-CM

## 2017-11-19 DIAGNOSIS — G43809 Other migraine, not intractable, without status migrainosus: Secondary | ICD-10-CM

## 2017-11-19 MED ORDER — TOPIRAMATE 50 MG PO TABS: 50.0000 mg | ORAL_TABLET | Freq: Every day | ORAL | 2 refills | Status: DC

## 2017-11-19 NOTE — Patient Instructions (Signed)
Migraine Recommendations: 1.  We will increase topiramate to  at bedtime.  If headaches not improved in 4 weeks, contact me and I will increase dose. 2.  Take naproxen at earliest onset of headache.  May repeat dose once in 12 hours if needed.  Limit use to no more than 2 days out of week. 3.  Limit use of pain relievers to no more than 2 days out of the week.  These medications include acetaminophen, ibuprofen, triptans and narcotics.  This will help reduce risk of rebound headaches. 4.  Be aware of common food triggers such as processed sweets, processed foods with nitrites (such as deli meat, hot dogs, sausages), foods with MSG, alcohol (such as wine), chocolate, certain cheeses, certain fruits (dried fruits, bananas, some citrus fruit), vinegar, diet soda. 4.  Avoid caffeine 5.  Routine exercise 6.  Proper sleep hygiene 7.  Stay adequately hydrated with water 8.  Keep a headache diary. 9.  Maintain proper stress management. 10.  Do not skip meals. 11.  Consider supplements:  Magnesium citrate  to  daily, riboflavin , Coenzyme Q 10  three times daily

## 2017-11-19 NOTE — Progress Notes (Signed)
NEUROLOGY FOLLOW UP OFFICE NOTE  Ann Frey [id number]  HISTORY OF PRESENT ILLNESS: Ann Frey is a 32 year old right-handed female who follows up for vestibular migraine and paresthesias.  UPDATE: MRI of brain with and without contrast was performed on 08/19/17, which was personally reviewed and demonstrated nonspecific mild scattered small subcortical white matter foci, likely related to migraine, and incidental developmental venous anomaly  Otherwise, no acute abnormalities.  She was started on nortriptyline  at bedtime, which was discontinued due to chest pain.  She was instead started on propranolol ER  daily.  However, propranolol was discontinued because she endorsed shortness of breath and rash.  She was then started on topiramate  at bedtime.  In the meantime, she has been experiencing chest pain, shortness of breath and palpitations for which she was evaluated twice in the ED in March.   Troponins, chest X-ray and EKG were normal.  She has since followed up with cardiology who did not suspect anything serious.  She was started on Micardis for elevated blood pressure.  She has daily headache, however severe migraines with vertigo occur 3 to 4 times a week, lasting 45 minutes.  She takes naproxen .  She does reports significant stress/anxiety but denies depression.  HISTORY: She began having headaches intermittently since 2013.  Location:  Temples/forehead bilateral.  Quality:  pounding.  Initial intensity:  10/10.  Aura:  no.  Prodrome:  no.  Associated symptoms:  Spinning sensation, nausea, phonophobia.  Whitening/fogginess of vision.  No vomiting, diplopia, slurred speech, focal numbness or weakness.  Initial Duration:  3 days.  Initial Frequency:  Twice a year but has daily dull headaches.  Triggers/exacerbating factors:  none.  Relieving factors:  none.  Activity:  Goes to hospital when severe.  Meclizine helps with dizziness.  Tylenol and ibuprofen  ineffective. In 2016, I prescribed her nortriptyline  at bedtime for preventative treatment and sumatriptan  with naproxen  for abortive treatment.  She never took the sumatriptan and naproxen, however she did start nortriptyline and migraines resolved.  She stopped taking it over a year ago.  She had been doing well until 07/28/17.  She developed sudden tingling of the entire upper and lower face which then involved the entire body with burning and redness in the hands.  She had associated generalized weakness, spinning sensation, nausea, shortness of breath and visual disturbance described as blurred vision with dimming of vision.  She denied headache at the time.  Head CT was personally reviewed and was unremarkable.  EKG and labs were unremarkable.  Since then, she continues to feel generalized weakness, dysesthesias of the face and entire body/extremities and lightheadedness. No family history of headache.  No previous history of headache.  PAST MEDICAL HISTORY: Past Medical History:  Diagnosis Date  . Hypertension   . No pertinent past medical history     MEDICATIONS: Current Outpatient Medications on File Prior to Visit  Medication Sig Dispense Refill  . levothyroxine (SYNTHROID, LEVOTHROID) 25 MCG tablet Take 25 mcg by mouth every morning.    . methyldopa (ALDOMET) 500 MG tablet Take 1 tablet (500 mg total) by mouth 2 (two) times daily. (Patient not taking: Reported on 09/28/2017) 60 tablet 2  . naproxen (NAPROSYN) 500 MG tablet Take 1 tablet (500 mg total) by mouth every 12 (twelve) hours as needed. (Patient not taking: Reported on 09/28/2017) 16 tablet 2  . naproxen sodium (ANAPROX) 550 MG tablet TAKE 1 TABLET BY MOUTH EVERY 12 HOURS AS NEEDED  16 tablet 2  . norethindrone-ethinyl estradiol (MICROGESTIN,JUNEL,LOESTRIN) 1-20 MG-MCG tablet Take 1 tablet by mouth at bedtime.    . nortriptyline (PAMELOR) 10 MG capsule Take 1 capsule (10 mg total) by mouth at bedtime. (Patient not  taking: Reported on 07/28/2017) 30 capsule 0  . propranolol ER (INDERAL LA) 80 MG 24 hr capsule Take 80 mg by mouth daily.    . SUMAtriptan (IMITREX) 100 MG tablet Take 1 tab at earliest onset of headache.  May repeat once in 2 hours if headache persists or recurs.  Do not exceed 2 tablets in 24 hours (Patient not taking: Reported on 07/28/2017) 10 tablet 2  . telmisartan-hydrochlorothiazide (MICARDIS HCT) 40-12.5 MG tablet Take 1 tablet by mouth daily.     No current facility-administered medications on file prior to visit.     ALLERGIES: Allergies  Allergen Reactions  . Propranolol Hcl     FAMILY HISTORY: Family History  Problem Relation Age of Onset  . Hypertension Father   . Diabetes Mother     SOCIAL HISTORY: Social History   Socioeconomic History  . Marital status: Married    Spouse name: Not on file  . Number of children: Not on file  . Years of education: Not on file  . Highest education level: Not on file  Occupational History  . Not on file  Social Needs  . Financial resource strain: Not on file  . Food insecurity:    Worry: Not on file    Inability: Not on file  . Transportation needs:    Medical: Not on file    Non-medical: Not on file  Tobacco Use  . Smoking status: Never Smoker  . Smokeless tobacco: Never Used  Substance and Sexual Activity  . Alcohol use: No  . Drug use: No  . Sexual activity: Not Currently  Lifestyle  . Physical activity:    Days per week: Not on file    Minutes per session: Not on file  . Stress: Not on file  Relationships  . Social connections:    Talks on phone: Not on file    Gets together: Not on file    Attends religious service: Not on file    Active member of club or organization: Not on file    Attends meetings of clubs or organizations: Not on file    Relationship status: Not on file  . Intimate partner violence:    Fear of current or ex partner: Not on file    Emotionally abused: Not on file    Physically abused:  Not on file    Forced sexual activity: Not on file  Other Topics Concern  . Not on file  Social History Narrative   H.S graduate.     REVIEW OF SYSTEMS: Constitutional: No fevers, chills, or sweats, no generalized fatigue, change in appetite Eyes: No visual changes, double vision, eye pain Ear, nose and throat: No hearing loss, ear pain, nasal congestion, sore throat Cardiovascular: No chest pain, palpitations Respiratory:  No shortness of breath at rest or with exertion, wheezes GastrointestinaI: No nausea, vomiting, diarrhea, abdominal pain, fecal incontinence Genitourinary:  No dysuria, urinary retention or frequency Musculoskeletal:  No neck pain, back pain Integumentary: No rash, pruritus, skin lesions Neurological: as above Psychiatric: No depression, insomnia, anxiety Endocrine: No palpitations, fatigue, diaphoresis, mood swings, change in appetite, change in weight, increased thirst Hematologic/Lymphatic:  No purpura, petechiae. Allergic/Immunologic: no itchy/runny eyes, nasal congestion, recent allergic reactions, rashes  PHYSICAL EXAM: Vitals:   11/19/17  0932  BP: 130/82  Pulse: (!) 102  SpO2: 99%   General: No acute distress.  Patient appears well-groomed.   Head:  Normocephalic/atraumatic Eyes:  Fundi examined but not visualized Neck: supple, no paraspinal tenderness, full range of motion Heart:  Regular rate and rhythm Lungs:  Clear to auscultation bilaterally Back: No paraspinal tenderness Neurological Exam: alert and oriented to person, place, and time. Attention span and concentration intact, recent and remote memory intact, fund of knowledge intact.  Speech fluent and not dysarthric, language intact.  CN II-XII intact. Bulk and tone normal, muscle strength 5/5 throughout.  Sensation to light touch  intact.  Deep tendon reflexes 2+ throughout.  Finger to nose testing intact.  Gait normal, Romberg negative.  IMPRESSION: Chronic migraine Vestibular  migraine  I explained to the patient that it may take time to finally get her headaches under control.  PLAN: 1.  Increase topiramate to  at bedtime.  We can increase dose in 4 weeks if needed. 2.  Naproxen  for abortive therapy, limited to no more than 2 days out of week.  Discussed rebound headache. 3.  Keep headache diary 4.  At this time, she defers referral to therapist to address stress management. 5.  Follow up in 3 months.  Shon Millet, DO  CC:  Iva Boop, MD

## 2018-01-06 ENCOUNTER — Telehealth: Payer: Self-pay | Admitting: Neurology

## 2018-01-06 NOTE — Telephone Encounter (Signed)
Please advise 

## 2018-01-06 NOTE — Telephone Encounter (Signed)
She can stop topiramate and instead start zonisamide 25mg  daily.

## 2018-01-06 NOTE — Telephone Encounter (Signed)
Spoke with patient. She states she actually just had blood work at her PCP this morning and wants to wait to see if anything else is going on before switching medications. I let her know that would be fine. She will stay on current medication for now. Dr. Everlena CooperJaffe - fyi.

## 2018-01-06 NOTE — Telephone Encounter (Signed)
Ann Frey Self [phone]   topiramate (TOPAMAX) 50 MG tablet  [name] called to say since she has increased her medication from her last office visit on 5.16.19 she first started having spells of being anxious, feeling warm under the skin, plus blurred vision and eyes hurting, in the last week these symptoms have gotten worse, with the feelings of being anxious and feeling warm under the skin with spells of the warmest being worse during these episodes she feels weak and like she is going to pass out. Please call back and advise.

## 2018-02-15 ENCOUNTER — Other Ambulatory Visit: Payer: Self-pay | Admitting: Neurology

## 2018-03-10 ENCOUNTER — Ambulatory Visit: Payer: BC Managed Care – PPO | Admitting: Neurology

## 2018-03-30 NOTE — Progress Notes (Signed)
NEUROLOGY FOLLOW UP OFFICE NOTE  Altha Sweitzer [id number]  HISTORY OF PRESENT ILLNESS: Ann Frey is a 32 year old right-handed female who follows up for migraines and vestibular migraines.   UPDATE: Intensity:  Moderate to severe Duration:  1/2 a day Frequency:  2 days a week.   Vertigo is now no longer episodic.  It is only positional, such as when she is bent over and quickly turns upright. Abortive therapy:  Naproxen 550mg  Frequency of abortive medication: once a week or less Current NSAIDS:  Naproxen 550mg  Current analgesics:  no Current triptans:  no Current ergotamine:  no Current anti-emetic:  no Current muscle relaxants:  no Current anti-anxiolytic:  no Current sleep aide:  no Current Antihypertensive medications:  Micardis Current Antidepressant medications:  no Current Anticonvulsant medications:  topiramate 50mg  at bedtime Current anti-CGRP:  no Current Vitamins/Herbal/Supplements:  no Current Antihistamines/Decongestants:  no Other therapy:  massages Hormone/birth control:  Nexplanon  Caffeine:  No coffee Alcohol:  no Smoker:  no Diet:  Drinks a lot of water now Exercise:  Walks daily Depression:  no; Anxiety:  no Other pain:  Sometimes neck pain Sleep hygiene:  Good.  HISTORY: She began having headaches intermittently since 2013.  Location:  Temples/forehead bilateral.  Quality:  pounding.  Initial intensity:  10/10.  Aura:  no.  Prodrome:  no.  Associated symptoms:  Spinning sensation, nausea, phonophobia.  Whitening/fogginess of vision.  No vomiting, diplopia, slurred speech, focal numbness or weakness.  Initial Duration:  3 days.  Initial Frequency:  Twice a year but has daily dull headaches.  Triggers/exacerbating factors:  feeling tired.  Relieving factors:  none.  Activity:  Goes to hospital when severe.      Past NSAIDS:  no Past analgesics:  Tylenol Past abortive triptans:  no Past abortive ergotamine:  no Past muscle relaxants:  no Past  anti-emetic:  no Past antihypertensive medications:  Propranolol ER 80mg  (shortness of breath and rash) Past antidepressant medications:  Nortriptyline 10mg  (chest pain) Past anticonvulsant medications:  no Past anti-CGRP:  no Past vitamins/Herbal/Supplements:  no Past antihistamines/decongestants:  no Other past therapies:  no  She has history of other somatic symptoms, particularly chest pain, palpitations and shortness of breath, which has been worked up by cardiology and testing negative.  07/28/17.  She developed sudden tingling of the entire upper and lower face which then involved the entire body with burning and redness in the hands.  She had associated generalized weakness, spinning sensation, nausea, shortness of breath and visual disturbance described as blurred vision with dimming of vision.  She denied headache at the time.  Head CT was personally reviewed and was unremarkable.  EKG and labs were unremarkable.  Since then, she continues to feel generalized weakness, dysesthesias of the face and entire body/extremities and lightheadedness.  MRI of brain with and without contrast was performed on 08/19/17, which was personally reviewed and demonstrated nonspecific mild scattered small subcortical white matter foci, likely related to migraine, and incidental developmental venous anomaly  Otherwise, no acute abnormalities.  PAST MEDICAL HISTORY: Past Medical History:  Diagnosis Date  . Hypertension   . No pertinent past medical history     MEDICATIONS: Current Outpatient Medications on File Prior to Visit  Medication Sig Dispense Refill  . levothyroxine (SYNTHROID, LEVOTHROID) 25 MCG tablet Take 25 mcg by mouth every morning.    . methyldopa (ALDOMET) 500 MG tablet Take 1 tablet (500 mg total) by mouth 2 (two) times daily. (Patient not taking:  Reported on 09/28/2017) 60 tablet 2  . naproxen (NAPROSYN) 500 MG tablet Take 1 tablet (500 mg total) by mouth every 12 (twelve) hours as  needed. (Patient not taking: Reported on 09/28/2017) 16 tablet 2  . naproxen sodium (ANAPROX) 550 MG tablet TAKE 1 TABLET BY MOUTH EVERY 12 HOURS AS NEEDED 16 tablet 2  . norethindrone-ethinyl estradiol (MICROGESTIN,JUNEL,LOESTRIN) 1-20 MG-MCG tablet Take 1 tablet by mouth at bedtime.    . nortriptyline (PAMELOR) 10 MG capsule Take 1 capsule (10 mg total) by mouth at bedtime. (Patient not taking: Reported on 07/28/2017) 30 capsule 0  . propranolol ER (INDERAL LA) 80 MG 24 hr capsule Take 80 mg by mouth daily.    . SUMAtriptan (IMITREX) 100 MG tablet Take 1 tab at earliest onset of headache.  May repeat once in 2 hours if headache persists or recurs.  Do not exceed 2 tablets in 24 hours (Patient not taking: Reported on 07/28/2017) 10 tablet 2  . telmisartan-hydrochlorothiazide (MICARDIS HCT) 40-12.5 MG tablet Take 1 tablet by mouth daily.    Marland Kitchen. topiramate (TOPAMAX) 50 MG tablet TAKE 1 TABLET BY MOUTH AT BEDTIME 30 tablet 2   No current facility-administered medications on file prior to visit.     ALLERGIES: Allergies  Allergen Reactions  . Propranolol Hcl     FAMILY HISTORY: Family History  Problem Relation Age of Onset  . Hypertension Father   . Diabetes Mother    SOCIAL HISTORY: Social History   Socioeconomic History  . Marital status: Married    Spouse name: Not on file  . Number of children: Not on file  . Years of education: Not on file  . Highest education level: Not on file  Occupational History  . Not on file  Social Needs  . Financial resource strain: Not on file  . Food insecurity:    Worry: Not on file    Inability: Not on file  . Transportation needs:    Medical: Not on file    Non-medical: Not on file  Tobacco Use  . Smoking status: Never Smoker  . Smokeless tobacco: Never Used  Substance and Sexual Activity  . Alcohol use: No  . Drug use: No  . Sexual activity: Not Currently  Lifestyle  . Physical activity:    Days per week: Not on file    Minutes per  session: Not on file  . Stress: Not on file  Relationships  . Social connections:    Talks on phone: Not on file    Gets together: Not on file    Attends religious service: Not on file    Active member of club or organization: Not on file    Attends meetings of clubs or organizations: Not on file    Relationship status: Not on file  . Intimate partner violence:    Fear of current or ex partner: Not on file    Emotionally abused: Not on file    Physically abused: Not on file    Forced sexual activity: Not on file  Other Topics Concern  . Not on file  Social History Narrative   H.S graduate.     REVIEW OF SYSTEMS: Constitutional: No fevers, chills, or sweats, no generalized fatigue, change in appetite Eyes: No visual changes, double vision, eye pain Ear, nose and throat: No hearing loss, ear pain, nasal congestion, sore throat Cardiovascular: No chest pain, palpitations Respiratory:  No shortness of breath at rest or with exertion, wheezes GastrointestinaI: No nausea, vomiting, diarrhea,  abdominal pain, fecal incontinence Genitourinary:  No dysuria, urinary retention or frequency Musculoskeletal:  Sometimes neck and shoulder pain Integumentary: No rash, pruritus, skin lesions Neurological: as above Psychiatric: No depression, insomnia, anxiety Endocrine: No palpitations, fatigue, diaphoresis, mood swings, change in appetite, change in weight, increased thirst Hematologic/Lymphatic:  No purpura, petechiae. Allergic/Immunologic: no itchy/runny eyes, nasal congestion, recent allergic reactions, rashes  PHYSICAL EXAM: Blood pressure 108/74, pulse 67, height 5\' 3"  (1.6 m), weight 202 lb (91.6 kg), SpO2 100 %. General: No acute distress.  Patient appears well-groomed.   Head:  Normocephalic/atraumatic Eyes:  Fundi examined but not visualized Neck: supple, no paraspinal tenderness, full range of motion Heart:  Regular rate and rhythm Lungs:  Clear to auscultation bilaterally Back:  No paraspinal tenderness Neurological Exam: alert and oriented to person, place, and time. Attention span and concentration intact, recent and remote memory intact, fund of knowledge intact.  Speech fluent and not dysarthric, language intact.  CN II-XII intact. Bulk and tone normal, muscle strength 5/5 throughout.  Sensation to light touch intact.  Deep tendon reflexes 2+ throughout, toes downgoing.  Finger to nose and heel to shin testing intact.  Gait normal, Romberg negative.  IMPRESSION: Migraine without aura, without status migrainosus, not intractable Vestibular migraine  Both stable  PLAN: 1.  To try and achieve further reduced migraine frequency, will increase topiramate to 100mg  at bedtime. 2.  At earliest onset of migraine, take sumatriptan 100mg  with naproxen 550mg .  May repeat dose after 2 hours if needed, not to exceed 2 doses in 24 hours. 3.  If she develops the neck and shoulder pain, may take Tylenol.  If you develop the migraine, then take the sumatriptan with naproxen.  If Tylenol does not treat the neck/shoulder pain, then she may take naproxen for it.  If you then get a migraine, take the sumatriptan alone. 4.  Limit use of pain relievers to no more than 2 days out of week to prevent risk of rebound or medication-overuse headache. 5.  Keep headache diary 6.  Follow up in 3 to 4 months.  Shon Millet, DO  CC: Iva Boop, MD

## 2018-03-31 ENCOUNTER — Ambulatory Visit: Payer: BC Managed Care – PPO | Admitting: Neurology

## 2018-03-31 ENCOUNTER — Encounter: Payer: Self-pay | Admitting: Neurology

## 2018-03-31 VITALS — BP 108/74 | HR 67 | Ht 63.0 in | Wt 202.0 lb

## 2018-03-31 DIAGNOSIS — G43809 Other migraine, not intractable, without status migrainosus: Secondary | ICD-10-CM

## 2018-03-31 DIAGNOSIS — G43009 Migraine without aura, not intractable, without status migrainosus: Secondary | ICD-10-CM

## 2018-03-31 DIAGNOSIS — G43109 Migraine with aura, not intractable, without status migrainosus: Secondary | ICD-10-CM

## 2018-03-31 MED ORDER — SUMATRIPTAN SUCCINATE 100 MG PO TABS: ORAL_TABLET | ORAL | 3 refills | Status: DC

## 2018-03-31 MED ORDER — TOPIRAMATE 100 MG PO TABS: 100.0000 mg | ORAL_TABLET | Freq: Every day | ORAL | 3 refills | Status: DC

## 2018-03-31 NOTE — Patient Instructions (Signed)
1.  We will increase topiramate to 100mg  at bedtime. 2.  At earliest onset of migraine, take sumatriptan 100mg  with naproxen 550mg .  May repeat dose after 2 hours if needed, not to exceed 2 doses in 24 hours. 3.  If you develop the neck and shoulder pain, may take Tylenol.  If you develop the migraine, then take the sumatriptan with naproxen.  If Tylenol does not treat the neck/shoulder pain, then you may take naproxen for it.  If you then get a migraine, take the sumatriptan alone. 4.  Limit use of pain relievers to no more than 2 days out of week to prevent risk of rebound or medication-overuse headache. 5.  Keep headache diary 6.  Follow up in 3 to 4 months.

## 2018-04-03 ENCOUNTER — Other Ambulatory Visit: Payer: Self-pay | Admitting: Neurology

## 2018-07-29 ENCOUNTER — Other Ambulatory Visit: Payer: Self-pay | Admitting: Neurology

## 2018-08-02 NOTE — Progress Notes (Signed)
NEUROLOGY FOLLOW UP OFFICE NOTE  Ann Frey [id number]  HISTORY OF PRESENT ILLNESS: Ann Frey is a 33 year old right-handed woman who follows up for migraines and vestibular migraine.  UPDATE: She had side effects to sumatriptan.  One time it caused weakness.  The second time, she became shaky, diaphoretic and weak and thinks she may have passed out.  She says migraines are worse.  When she has a migraine now, her tongue feels funny but she cannot really elaborate.  She thinks they may be worse due to Nexplanon because they became worse shortly after having it implanted.   Intensity:  Moderate to severe Duration:  A couple of hours with naproxen but then returns later that day. Frequency:  Almost daily Rescue protocol: Naproxen Current NSAIDS: Naproxen 550 mg  Takes naproxen twice a week Current analgesics: None Current triptans: Sumatriptan 100 mg Current ergotamine: None Current anti-emetic: None Current muscle relaxants: None Current anti-anxiolytic: None Current sleep aide: None Current Antihypertensive medications: Micardis Current Antidepressant medications: None Current Anticonvulsant medications: Topiramate 100 mg at bedtime Current anti-CGRP: None Current Vitamins/Herbal/Supplements: None Current Antihistamines/Decongestants: None Other therapy: None Hormone/birth control: Nexplanon  Caffeine: No coffee Alcohol: No Smoker: No Diet: Drinks plenty of water Exercise: Walks daily Depression: No; Anxiety: No Other pain: None times neck pain Sleep hygiene: Good  HISTORY:  She began having headaches intermittently since 2013. Location: Temples/forehead bilateral. Quality: pounding. Initial intensity: 10/10. Aura: no. Prodrome: no. Associated symptoms: Spinning sensation, nausea, phonophobia. Whitening/fogginess of vision. No vomiting, diplopia, slurred speech, focal numbness or weakness. Initial Duration: 3 days. Initial Frequency: Twice a year but  has daily dull headaches. Triggers: Feeling tired. Relieving factors: none. Activity: Goes to hospital when severe.    Past NSAIDS:  no Past analgesics:  Tylenol Past abortive triptans:  no Past abortive ergotamine:  no Past muscle relaxants:  no Past anti-emetic:  no Past antihypertensive medications:  Propranolol ER 80mg  (shortness of breath and rash) Past antidepressant medications:  Nortriptyline 10mg  (chest pain) Past anticonvulsant medications:  no Past anti-CGRP:  no Past vitamins/Herbal/Supplements:  no Past antihistamines/decongestants:  no Other past therapies:  no  She has history of other somatic symptoms, particularly chest pain, palpitations and shortness of breath, which has been worked up by cardiology and testing negative.  07/28/17. She developed sudden tingling of the entire upper and lower face which then involved the entire body with burning and redness in the hands. She had associated generalized weakness, spinning sensation, nausea, shortness of breath and visual disturbance described as blurred vision with dimming of vision. She denied headache at the time. Head CT was personally reviewed and was unremarkable. EKG and labs were unremarkable. Since then, she continues to feel generalized weakness, dysesthesias of the face and entire body/extremities and lightheadedness.  MRI of brain with and without contrast was performed on 08/19/17, which was personally reviewed and demonstrated nonspecific mild scattered small subcortical white matter foci, likely related to migraine, and incidental developmental venous anomalyOtherwise, no acute abnormalities.  PAST MEDICAL HISTORY: Past Medical History:  Diagnosis Date  . Hypertension   . No pertinent past medical history     MEDICATIONS: Current Outpatient Medications on File Prior to Visit  Medication Sig Dispense Refill  . Etonogestrel (NEXPLANON [place]) Inject into the skin.    . naproxen sodium (ANAPROX)  550 MG tablet TAKE 1 TABLET BY MOUTH EVERY 12 HOURS AS NEEDED 16 tablet 2  . SUMAtriptan (IMITREX) 100 MG tablet Take 1 tablet earliest onset of migraine.  May repeat x1 in 2 hours if headache persists or recurs.  Do not exceed 2 tablets in 24h. 10 tablet 3  . telmisartan-hydrochlorothiazide (MICARDIS HCT) 40-12.5 MG tablet Take 1 tablet by mouth daily.    Marland Kitchen. topiramate (TOPAMAX) 100 MG tablet TAKE 1 TABLET BY MOUTH AT BEDTIME 30 tablet 5   No current facility-administered medications on file prior to visit.     ALLERGIES: Allergies  Allergen Reactions  . Propranolol Hcl     FAMILY HISTORY: Family History  Problem Relation Age of Onset  . Hypertension Father   . Diabetes Mother    SOCIAL HISTORY: Social History   Socioeconomic History  . Marital status: Married    Spouse name: Not on file  . Number of children: Not on file  . Years of education: Not on file  . Highest education level: Not on file  Occupational History  . Not on file  Social Needs  . Financial resource strain: Not on file  . Food insecurity:    Worry: Not on file    Inability: Not on file  . Transportation needs:    Medical: Not on file    Non-medical: Not on file  Tobacco Use  . Smoking status: Never Smoker  . Smokeless tobacco: Never Used  Substance and Sexual Activity  . Alcohol use: No  . Drug use: No  . Sexual activity: Not Currently  Lifestyle  . Physical activity:    Days per week: Not on file    Minutes per session: Not on file  . Stress: Not on file  Relationships  . Social connections:    Talks on phone: Not on file    Gets together: Not on file    Attends religious service: Not on file    Active member of club or organization: Not on file    Attends meetings of clubs or organizations: Not on file    Relationship status: Not on file  . Intimate partner violence:    Fear of current or ex partner: Not on file    Emotionally abused: Not on file    Physically abused: Not on file     Forced sexual activity: Not on file  Other Topics Concern  . Not on file  Social History Narrative   H.S graduate.     REVIEW OF SYSTEMS: Constitutional: No fevers, chills, or sweats, no generalized fatigue, change in appetite Eyes: No visual changes, double vision, eye pain Ear, nose and throat: No hearing loss, ear pain, nasal congestion, sore throat Cardiovascular: No chest pain, palpitations Respiratory:  No shortness of breath at rest or with exertion, wheezes GastrointestinaI: No nausea, vomiting, diarrhea, abdominal pain, fecal incontinence Genitourinary:  No dysuria, urinary retention or frequency Musculoskeletal:  No neck pain, back pain Integumentary: No rash, pruritus, skin lesions Neurological: as above Psychiatric: No depression, insomnia, anxiety Endocrine: No palpitations, fatigue, diaphoresis, mood swings, change in appetite, change in weight, increased thirst Hematologic/Lymphatic:  No purpura, petechiae. Allergic/Immunologic: no itchy/runny eyes, nasal congestion, recent allergic reactions, rashes  PHYSICAL EXAM: Blood pressure 110/70, pulse 82, height 5\' 3"  (1.6 m), weight 206 lb (93.4 kg), SpO2 98 %. General: No acute distress.  Patient appears well-groomed.  Head:  Normocephalic/atraumatic Eyes:  Fundi examined but not visualized Neck: supple, no paraspinal tenderness, full range of motion Heart:  Regular rate and rhythm Lungs:  Clear to auscultation bilaterally Back: No paraspinal tenderness Neurological Exam: alert and oriented to person, place, and time. Attention span and concentration intact,  recent and remote memory intact, fund of knowledge intact.  Speech fluent and not dysarthric, language intact.  CN II-XII intact. Bulk and tone normal, muscle strength 5/5 throughout.  Sensation to light touch, temperature and vibration intact.  Deep tendon reflexes 2+ throughout, toes downgoing.  Finger to nose and heel to shin testing intact.  Gait normal, Romberg  negative.  IMPRESSION: 1.  Migraine without aura, without status migrainosus, not intractable 2.  Vestibular migraine  PLAN: 1. I think it is reasonable to have the Nexplanon removed since she noted a surge in headaches after it was implanted.  2. For preventative management, we will increase topiramate to 150mg  at bedtime.  In 6 weeks we can increase to 200mg  at bedtime if needed. 3.  For abortive therapy, she will stop sumatriptan and instead try rizatriptan 10mg . 4.  Limit use of pain relievers to no more than 2 days out of week to prevent risk of rebound or medication-overuse headache. 5.  Keep headache diary 6.  Exercise, hydration, caffeine cessation, sleep hygiene, monitor for and avoid triggers 7.  Consider:  magnesium citrate 400mg  daily, riboflavin 400mg  daily, and coenzyme Q10 100mg  three times daily 8.  Follow up in 4 months.  Shon MilletAdam Arah Aro, DO  CC: Iva BoopKevin Via, MD

## 2018-08-03 ENCOUNTER — Ambulatory Visit: Payer: BC Managed Care – PPO | Admitting: Neurology

## 2018-08-03 ENCOUNTER — Encounter: Payer: Self-pay | Admitting: Neurology

## 2018-08-03 VITALS — BP 110/70 | HR 82 | Ht 63.0 in | Wt 206.0 lb

## 2018-08-03 DIAGNOSIS — G43709 Chronic migraine without aura, not intractable, without status migrainosus: Secondary | ICD-10-CM | POA: Diagnosis not present

## 2018-08-03 MED ORDER — RIZATRIPTAN BENZOATE 10 MG PO TBDP: ORAL_TABLET | ORAL | 3 refills | Status: DC

## 2018-08-03 MED ORDER — TOPIRAMATE 100 MG PO TABS: 150.0000 mg | ORAL_TABLET | Freq: Every day | ORAL | 3 refills | Status: DC

## 2018-08-03 NOTE — Patient Instructions (Signed)
1.  Increase topiramate 100mg  to 1 and 1/2 tablets at bedtime.  Contact me in 6 weeks if headaches not improved. 2.  Stop sumatriptan.  Instead, use rizatriptan (Maxalt) 1 tablet earliest onset of migraine.  May repeat once after 2 hours if needed, maximum 2 tablets in 24 hours.  May take each dose with naproxen. 3.  I agree to have Nexplanon removed 4.  Limit use of pain relievers to no more than 2 days out of week to prevent risk of rebound or medication-overuse headache. 5.  Keep headache diary 6.  Follow up in 4 months.

## 2018-11-02 ENCOUNTER — Other Ambulatory Visit: Payer: Self-pay | Admitting: Neurology

## 2018-12-02 ENCOUNTER — Encounter: Payer: Self-pay | Admitting: Cardiology

## 2018-12-02 ENCOUNTER — Ambulatory Visit: Payer: BC Managed Care – PPO | Admitting: Cardiology

## 2018-12-02 ENCOUNTER — Other Ambulatory Visit: Payer: Self-pay

## 2018-12-02 VITALS — BP 147/87 | HR 117 | Ht 64.0 in | Wt 207.6 lb

## 2018-12-02 DIAGNOSIS — R030 Elevated blood-pressure reading, without diagnosis of hypertension: Secondary | ICD-10-CM

## 2018-12-02 DIAGNOSIS — R0789 Other chest pain: Secondary | ICD-10-CM

## 2018-12-02 DIAGNOSIS — R000 Tachycardia, unspecified: Secondary | ICD-10-CM | POA: Diagnosis not present

## 2018-12-02 MED ORDER — DILTIAZEM HCL ER COATED BEADS 120 MG PO CP24: 120.0000 mg | ORAL_CAPSULE | Freq: Every day | ORAL | 2 refills | Status: DC

## 2018-12-02 NOTE — Progress Notes (Signed)
Patient referred by Vernie Shanks, MD for tachycardia  Subjective:   Ann Frey, female    DOB: April 14, 1986, 33 y.o.   MRN: [id number]   Chief Complaint  Patient presents with  . Tachycardia  . Hypertension    elevated BP  . New Patient (Initial Visit)    HPI  33 y.o. African American female referred for evaluation of tachycardia.  Patient is originally from [place], has lived in [place] since 2012. 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 She has had complaints of fast heart rate for a long time. Patient was previously seen by Dr. Wynonia Lawman in 2019 for similar complaints.  Work-up with treadmill stress test, event monitor, and echocardiogram was essentially normal. Patient reports episodes of chest pressure and left arm numbness lasting for 5-10 min, unrelated to exertion. She reports significant anxiety. She was taking metoprolol, but developed wheezing and chest tightness with it, thus stopped. Currently, she is not on any antihypertensive therapy. She does have h/o pre-eclampsia during pregnancy.   Past Medical History:  Diagnosis Date  . Hypertension   . No pertinent past medical history      Past Surgical History:  Procedure Laterality Date  . CESAREAN SECTION    . CESAREAN SECTION  05/25/2011   Procedure: CESAREAN SECTION;  Surgeon: Guss Bunde, MD;  Location: [facility];  Service: Gynecology;  Laterality: N/A;  Repeat cesarean section with delivery of baby boy at 64. Apgars 6/9.     Social History   Socioeconomic History  . Marital status: Married    Spouse name: Not on file  . Number of children: Not on file  . Years of education: Not on file  . Highest education level: Not on file  Occupational History  . Not on file  Social Needs  . Financial resource strain: Not on file  . Food insecurity:    Worry: Not on file    Inability: Not on file  . Transportation needs:    Medical: Not on file    Non-medical: Not on file   Tobacco Use  . Smoking status: Never Smoker  . Smokeless tobacco: Never Used  Substance and Sexual Activity  . Alcohol use: No  . Drug use: No  . Sexual activity: Not Currently  Lifestyle  . Physical activity:    Days per week: Not on file    Minutes per session: Not on file  . Stress: Not on file  Relationships  . Social connections:    Talks on phone: Not on file    Gets together: Not on file    Attends religious service: Not on file    Active member of club or organization: Not on file    Attends meetings of clubs or organizations: Not on file    Relationship status: Not on file  . Intimate partner violence:    Fear of current or ex partner: Not on file    Emotionally abused: Not on file    Physically abused: Not on file    Forced sexual activity: Not on file  Other Topics Concern  . Not on file  Social History Narrative   H.S graduate.      Family History  Problem Relation Age of Onset  . Hypertension Father   . Diabetes Mother      Current Outpatient Medications on File Prior to Visit  Medication Sig Dispense Refill  . Etonogestrel (NEXPLANON [place]) Inject into the skin.    Marland Kitchen  naproxen sodium (ANAPROX) 550 MG tablet TAKE 1 TABLET BY MOUTH EVERY 12 HOURS AS NEEDED 16 tablet 0  . rizatriptan (MAXALT-MLT) 10 MG disintegrating tablet Take 1 tablet earliest onset of migraine.  May repeat x1 in 2 hours if needed. Max 2 tablets/24h 9 tablet 3  . telmisartan-hydrochlorothiazide (MICARDIS HCT) 40-12.5 MG tablet Take 1 tablet by mouth daily.    Marland Kitchen topiramate (TOPAMAX) 100 MG tablet Take 1.5 tablets (150 mg total) by mouth at bedtime. 45 tablet 3   No current facility-administered medications on file prior to visit.     Cardiovascular studies:  EKG 0/28/2020: Sinus tachycardia 102 bpm.  Left atrial enlargement.  No change compared to previous EKG's  EKG treadmill stress test 10/06/2017: 8 METS. Dyspnea and fatigue. EKG negative for ischemia  Recent labs:  10/22/2018: H/H 12.7/38.5. MCV 80.  ESR 42 (chronically elevated). CRP  Normal. TSH 4.3 normal  09/21/2018: Glucose 87. BUN/Cr 10/0.91. eGFR 85. Na/K 139/4.1 Rest of the CMP normal.   Echocardiogram 10/05/2017: EF 55%. Mild mitral regurgitation.  Review of Systems  Constitution: Negative for decreased appetite, malaise/fatigue, weight gain and weight loss.  HENT: Negative for congestion.   Eyes: Negative for visual disturbance.  Cardiovascular: Positive for chest pain and palpitations. Negative for dyspnea on exertion, leg swelling and syncope.  Respiratory: Positive for shortness of breath. Negative for cough.   Endocrine: Negative for cold intolerance.  Hematologic/Lymphatic: Does not bruise/bleed easily.  Skin: Negative for itching and rash.  Musculoskeletal: Negative for myalgias.  Gastrointestinal: Negative for abdominal pain, nausea and vomiting.  Genitourinary: Negative for dysuria.  Neurological: Negative for dizziness and weakness.  Psychiatric/Behavioral: The patient is not nervous/anxious.   All other systems reviewed and are negative.        Vitals:   12/02/18 1050  BP: (!) 147/87  Pulse: (!) 117  SpO2: 100%     Body mass index is 35.63 kg/m. Filed Weights   12/02/18 1050  Weight: 94.2 kg     Objective:   Physical Exam  Constitutional: She is oriented to person, place, and time. She appears well-developed and well-nourished. No distress.  HENT:  Head: Normocephalic and atraumatic.  Eyes: Pupils are equal, round, and reactive to light. Conjunctivae are normal.  Neck: No JVD present.  Cardiovascular: Regular rhythm and intact distal pulses. Tachycardia present.  No murmur heard. Pulmonary/Chest: Effort normal and breath sounds normal. She has no wheezes. She has no rales.  Abdominal: Soft. Bowel sounds are normal. There is no rebound.  Musculoskeletal:        General: No edema.  Lymphadenopathy:    She has no cervical adenopathy.   Neurological: She is alert and oriented to person, place, and time. No cranial nerve deficit.  Skin: Skin is warm and dry.  Psychiatric: She has a normal mood and affect.  Nursing note and vitals reviewed.         Assessment & Recommendations:   33 y.o. African American female referred for evaluation of tachycardia, chest pain.  Chest pain: Atypical. Prior negative tress test for similar symptoms only a year ago. I do not think she needs repeat testing at this time. I do suspect anxiety is playing a role.  Sinus tachycardia, elevated blood pressure:   No secondary cause found previously. Suspect inappropriate sinus tachycardia vs anxiety. Given her elevated BP, I have started her on Diltiazem 120 mg daily.   Virtual visit f/u in 4 weeks.    Thank you for referring the patient to  [place]. Please feel free to contact with any questions.  Nigel Mormon, MD [facility] Pager: [phone] Office: [phone] If no answer Cell [phone]

## 2018-12-05 NOTE — Progress Notes (Signed)
Virtual Visit via Video Note The purpose of this virtual visit is to provide medical care while limiting exposure to the novel coronavirus.    Consent was obtained for video visit:  Yes Answered questions that patient had about telehealth interaction:  Yes I discussed the limitations, risks, security and privacy concerns of performing an evaluation and management service by telemedicine. I also discussed with the patient that there may be a patient responsible charge related to this service. The patient expressed understanding and agreed to proceed.  Pt location: Home Physician Location: Home Name of referring provider:  Via, Caryn BeeKevin, MD I connected with Ann SmothersNawa Frey at patients initiation/request on 12/06/2018 at  2:50 PM EDT by video enabled telemedicine application and verified that I am speaking with the correct person using two identifiers. Pt MRN:  [id number] Pt DOB:  1985/07/22 Video Participants:  Ann SmothersNawa Frey   History of Present Illness:  Ann Smothersawa Frey is a 33 year old right-handed woman with sinus tachycardia who follows up for migraines  UPDATE: Nexplanon was removed. Intensity:  Moderate to severe Duration:  2 hours Frequency:  2 days a week Rescue protocol: rizatriptan with or without naproxen and nap. Current NSAIDS: Naproxen 550 mg Current analgesics: None Current triptans: rizatriptan 10mg  Current ergotamine: None Current anti-emetic: None Current muscle relaxants: None Current anti-anxiolytic: None Current sleep aide: None Current Antihypertensive medications: Cardizem Current Antidepressant medications: None Current Anticonvulsant medications: Topiramate 150 mg at bedtime Current anti-CGRP: None Current Vitamins/Herbal/Supplements: None Current Antihistamines/Decongestants: None Other therapy: None  Caffeine: No coffee Alcohol: No Smoker: No Diet: Drinks plenty of water Exercise: Walks daily Depression: No; Anxiety: No Other pain: None times neck pain  Sleep hygiene: Good  HISTORY:  She began having headaches intermittently since 2013. Location: Temples/forehead bilateral. Quality: pounding. Initial intensity: 10/10. Aura: no. Prodrome: no. Associated symptoms:  Spinning sensation, nausea, phonophobia. Whitening/fogginess of vision. No vomiting, diplopia, slurred speech, focal numbness or weakness. Initial Duration: 3 days. Initial Frequency: Twice a year but has daily dull headaches. Triggers: Fatigue. Relieving factors:  None. Activity: Goes to hospital when severe.  Past NSAIDS:no Past analgesics:Tylenol Past abortive triptans:sumatriptan (did not tolerate) Past abortive ergotamine:no Past muscle relaxants:no Past anti-emetic:no Past antihypertensive medications:Propranolol ER 80mg  (shortness of breath and rash), metoprolol (lightheadedness) Past antidepressant medications:Nortriptyline 10mg  (chest pain) Past anticonvulsant medications:no Past anti-CGRP:no Past vitamins/Herbal/Supplements:no Past antihistamines/decongestants:no Other past therapies:no  She has history of other somatic symptoms, particularly chest pain, palpitations and shortness of breath, which has been worked up by cardiology and testing negative.  07/28/17. She developed sudden tingling of the entire upper and lower face which then involved the entire body with burning and redness in the hands. She had associated generalized weakness, spinning sensation, nausea, shortness of breath and visual disturbance described as blurred vision with dimming of vision. She denied headache at the time. Head CT was personally reviewed and was unremarkable. EKG and labs were unremarkable. Since then, she continues to feel generalized weakness, dysesthesias of the face and entire body/extremities and lightheadedness. MRI of brain with and without contrast was performed on 08/19/17, which was personally reviewed and demonstrated  nonspecific mild scattered small subcortical white matter foci, likely related to migraine, and incidental developmental venous anomalyOtherwise, no acute abnormalities.   Past Medical History: Past Medical History:  Diagnosis Date  . Hypertension   . Migraines   . No pertinent past medical history     Medications: Outpatient Encounter Medications as of 12/06/2018  Medication Sig  . diltiazem (CARDIZEM CD) 120  MG 24 hr capsule Take 1 capsule (120 mg total) by mouth daily.  . Etonogestrel (NEXPLANON [place]) Inject into the skin.  . ferrous sulfate 324 MG TBEC Take 65 mg by mouth daily.  . naproxen sodium (ANAPROX) 550 MG tablet TAKE 1 TABLET BY MOUTH EVERY 12 HOURS AS NEEDED  . rizatriptan (MAXALT-MLT) 10 MG disintegrating tablet Take 1 tablet earliest onset of migraine.  May repeat x1 in 2 hours if needed. Max 2 tablets/24h  . telmisartan-hydrochlorothiazide (MICARDIS HCT) 40-12.5 MG tablet Take 1 tablet by mouth daily.  Marland Kitchen topiramate (TOPAMAX) 100 MG tablet Take 1.5 tablets (150 mg total) by mouth at bedtime.   No facility-administered encounter medications on file as of 12/06/2018.     Allergies: Allergies  Allergen Reactions  . Metoprolol   . Propranolol Hcl     Family History: Family History  Problem Relation Age of Onset  . Hypertension Father   . Diabetes Mother   . Hypertension Mother     Social History: Social History   Socioeconomic History  . Marital status: Married    Spouse name: Not on file  . Number of children: 2  . Years of education: Not on file  . Highest education level: Not on file  Occupational History  . Not on file  Social Needs  . Financial resource strain: Not on file  . Food insecurity:    Worry: Not on file    Inability: Not on file  . Transportation needs:    Medical: Not on file    Non-medical: Not on file  Tobacco Use  . Smoking status: Never Smoker  . Smokeless tobacco: Never Used  Substance and Sexual Activity  . Alcohol use: No   . Drug use: No  . Sexual activity: Not Currently  Lifestyle  . Physical activity:    Days per week: Not on file    Minutes per session: Not on file  . Stress: Not on file  Relationships  . Social connections:    Talks on phone: Not on file    Gets together: Not on file    Attends religious service: Not on file    Active member of club or organization: Not on file    Attends meetings of clubs or organizations: Not on file    Relationship status: Not on file  . Intimate partner violence:    Fear of current or ex partner: Not on file    Emotionally abused: Not on file    Physically abused: Not on file    Forced sexual activity: Not on file  Other Topics Concern  . Not on file  Social History Narrative   H.S graduate.     Observations/Objective:   Height  (1.626 m), weight 206 lb (93.4 kg). No acute distress.  Alert and oriented.  Speech fluent and not dysarthric.  Language intact.  Face symmetric.    Assessment and Plan:   1.  Migraine without aura, without status migrainosus, not intractable 2.  Vestibular migraine  1.  For preventative management, topiramate  at bedtime (refilled today).  If headaches increase, we can increase dose to  at bedtime. 2.  For abortive therapy, rizatriptan  with or without naproxen  3.  Limit use of pain relievers to no more than 2 days out of week to prevent risk of rebound or medication-overuse headache. 4.  Keep headache diary 5.  Exercise, hydration, caffeine cessation, sleep hygiene, monitor for and avoid triggers 6.  Consider:  magnesium  citrate 400mg  daily, riboflavin 400mg  daily, and coenzyme Q10 100mg  three times daily 7. Always keep in mind that currently taking a hormone or birth control may be a possible trigger or aggravating factor for migraine. 8. Follow up in 6 months.  Follow Up Instructions:    -I discussed the assessment and treatment plan with the patient. The patient was provided an opportunity  to ask questions and all were answered. The patient agreed with the plan and demonstrated an understanding of the instructions.   Cira Servant, DO

## 2018-12-06 ENCOUNTER — Other Ambulatory Visit: Payer: Self-pay

## 2018-12-06 ENCOUNTER — Telehealth (INDEPENDENT_AMBULATORY_CARE_PROVIDER_SITE_OTHER): Payer: BC Managed Care – PPO | Admitting: Neurology

## 2018-12-06 ENCOUNTER — Encounter: Payer: Self-pay | Admitting: Neurology

## 2018-12-06 VITALS — Ht 64.0 in | Wt 206.0 lb

## 2018-12-06 DIAGNOSIS — G43009 Migraine without aura, not intractable, without status migrainosus: Secondary | ICD-10-CM

## 2018-12-06 DIAGNOSIS — G43809 Other migraine, not intractable, without status migrainosus: Secondary | ICD-10-CM

## 2018-12-06 MED ORDER — TOPIRAMATE 100 MG PO TABS: 150.0000 mg | ORAL_TABLET | Freq: Every day | ORAL | 5 refills | Status: DC

## 2018-12-06 MED ORDER — TOPIRAMATE 100 MG PO TABS: 150.0000 mg | ORAL_TABLET | Freq: Every day | ORAL | 3 refills | Status: DC

## 2018-12-07 ENCOUNTER — Ambulatory Visit: Payer: BC Managed Care – PPO | Admitting: Neurology

## 2018-12-29 ENCOUNTER — Encounter: Payer: Self-pay | Admitting: Cardiology

## 2018-12-29 ENCOUNTER — Ambulatory Visit: Payer: BC Managed Care – PPO | Admitting: Cardiology

## 2018-12-29 ENCOUNTER — Other Ambulatory Visit: Payer: Self-pay

## 2018-12-29 VITALS — BP 124/81 | HR 82

## 2018-12-29 DIAGNOSIS — R0600 Dyspnea, unspecified: Secondary | ICD-10-CM

## 2018-12-29 DIAGNOSIS — R000 Tachycardia, unspecified: Secondary | ICD-10-CM | POA: Diagnosis not present

## 2018-12-29 DIAGNOSIS — R0789 Other chest pain: Secondary | ICD-10-CM

## 2018-12-29 DIAGNOSIS — R0609 Other forms of dyspnea: Secondary | ICD-10-CM

## 2018-12-29 MED ORDER — DILTIAZEM HCL ER COATED BEADS 120 MG PO CP24: 120.0000 mg | ORAL_CAPSULE | Freq: Every day | ORAL | 2 refills | Status: DC

## 2018-12-29 NOTE — Progress Notes (Addendum)
Patient referred by Vernie Shanks, MD for tachycardia  Subjective:   Ann Frey, female    DOB: 18-Feb-1986, 33 y.o.   MRN: [id number]  I connected with the patient on 12/29/2018 by a telephone call and verified that I am speaking with the correct person using two identifiers.     I offered the patient a video enabled application for a virtual visit. Unfortunately, this could not be accomplished due to technical difficulties/lack of video enabled phone/computer. I discussed the limitations of evaluation and management by telemedicine and the availability of in person appointments. The patient expressed understanding and agreed to proceed.   This visit type was conducted due to national recommendations for restrictions regarding the COVID-19 Pandemic (e.g. social distancing).  This format is felt to be most appropriate for this patient at this time.  All issues noted in this document were discussed and addressed.  No physical exam was performed (except for noted visual exam findings with Tele health visits).  The patient has consented to conduct a Tele health visit and understands insurance will be billed.    Chief Complaint  Patient presents with  . Tachycardia    HPI  33 y.o. African American female referred for evaluation of tachycardia.  Heart rate and blood pressure are better controlled since starting diltiazem.  Patient continues to walk on treadmill, but unable to run due to shortness of breath.  This is stable and unchanged.  As described before, she underwent reassuring echocardiogram and treadmill exercise stress test in 10/2017.  She has history of asthma in the family and concerned if he has asthma as well.  She reports noticing "irregualr heart beat" on her blood pressure monitor once in a while.  She denies any palpitations or related symptoms.    Past Medical History:  Diagnosis Date  . Hypertension   . Migraines   . No pertinent past medical history      Past  Surgical History:  Procedure Laterality Date  . CESAREAN SECTION    . CESAREAN SECTION  05/25/2011   Procedure: CESAREAN SECTION;  Surgeon: Guss Bunde, MD;  Location: [facility];  Service: Gynecology;  Laterality: N/A;  Repeat cesarean section with delivery of baby boy at 30. Apgars 6/9.     Social History   Socioeconomic History  . Marital status: Married    Spouse name: Not on file  . Number of children: 2  . Years of education: Not on file  . Highest education level: Not on file  Occupational History  . Not on file  Social Needs  . Financial resource strain: Not on file  . Food insecurity    Worry: Not on file    Inability: Not on file  . Transportation needs    Medical: Not on file    Non-medical: Not on file  Tobacco Use  . Smoking status: Never Smoker  . Smokeless tobacco: Never Used  Substance and Sexual Activity  . Alcohol use: No  . Drug use: No  . Sexual activity: Not Currently  Lifestyle  . Physical activity    Days per week: Not on file    Minutes per session: Not on file  . Stress: Not on file  Relationships  . Social Herbalist on phone: Not on file    Gets together: Not on file    Attends religious service: Not on file    Active member of club or organization: Not on file  Attends meetings of clubs or organizations: Not on file    Relationship status: Not on file  . Intimate partner violence    Fear of current or ex partner: Not on file    Emotionally abused: Not on file    Physically abused: Not on file    Forced sexual activity: Not on file  Other Topics Concern  . Not on file  Social History Narrative   H.S graduate.    Unemployed due to [place] in two story home with husband and 2 sons     Family History  Problem Relation Age of Onset  . Hypertension Father   . Diabetes Mother   . Hypertension Mother      Current Outpatient Medications on File Prior to Visit  Medication Sig Dispense Refill  . diltiazem  (CARDIZEM CD) 120 MG 24 hr capsule Take 1 capsule (120 mg total) by mouth daily. 30 capsule 2  . ferrous sulfate 324 MG TBEC Take 65 mg by mouth daily.    . naproxen sodium (ANAPROX) 550 MG tablet TAKE 1 TABLET BY MOUTH EVERY 12 HOURS AS NEEDED 16 tablet 0  . rizatriptan (MAXALT-MLT) 10 MG disintegrating tablet Take 1 tablet earliest onset of migraine.  May repeat x1 in 2 hours if needed. Max 2 tablets/24h 9 tablet 3  . topiramate (TOPAMAX) 100 MG tablet Take 1.5 tablets (150 mg total) by mouth at bedtime. 45 tablet 5   No current facility-administered medications on file prior to visit.     Cardiovascular studies:  EKG 12/02/2018: Sinus tachycardia 102 bpm.  Left atrial enlargement.  No change compared to previous EKG's  EKG treadmill stress test 10/06/2017: 8 METS. Dyspnea and fatigue. EKG negative for ischemia   Recent labs: 10/22/2018: H/H 12.7/38.5. MCV 80.  ESR 42 (chronically elevated). CRP  Normal. TSH 4.3 normal  09/21/2018: Glucose 87. BUN/Cr 10/0.91. eGFR 85. Na/K 139/4.1 Rest of the CMP normal.   Echocardiogram 10/05/2017: EF 55%. Mild mitral regurgitation.  Review of Systems  Constitution: Negative for decreased appetite, malaise/fatigue, weight gain and weight loss.  HENT: Negative for congestion.   Eyes: Negative for visual disturbance.  Cardiovascular: Positive for chest pain and palpitations. Negative for dyspnea on exertion, leg swelling and syncope.  Respiratory: Positive for shortness of breath. Negative for cough.   Endocrine: Negative for cold intolerance.  Hematologic/Lymphatic: Does not bruise/bleed easily.  Skin: Negative for itching and rash.  Musculoskeletal: Negative for myalgias.  Gastrointestinal: Negative for abdominal pain, nausea and vomiting.  Genitourinary: Negative for dysuria.  Neurological: Negative for dizziness and weakness.  Psychiatric/Behavioral: The patient is not nervous/anxious.   All other systems reviewed and are  negative.        There were no vitals filed for this visit.   There is no height or weight on file to calculate BMI. There were no vitals filed for this visit.   Objective:   Physical Exam   Not performed Telephone visit.       Assessment & Recommendations:   33 y.o. African American female referred for evaluation of tachycardia, chest pain.  Chest pain: Atypical. Prior negative tress test for similar symptoms only a year ago. I do not think she needs repeat testing at this time. I do suspect anxiety is playing a role.  Sinus tachycardia, elevated blood pressure:   Improved with diltiazem 120 mg. Refilled the same.   Exertional dyspnea: Mild. Reassuring echocardiogram 10/2017.  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 Follow up with PCP.  I will see her on as needed basis.   Nigel Mormon, MD [facility] [place] [facility] [place] Pager: [phone] Office: [phone] If no answer Cell [phone]

## 2019-01-07 ENCOUNTER — Other Ambulatory Visit: Payer: Self-pay | Admitting: Neurology

## 2019-02-22 NOTE — Telephone Encounter (Signed)
I would like to switch her to extended release topiramate 150mg daily.  While topiramate may cause numbness and tingling sensation, extended release topiramate may less likely cause it.  I believe Qudexy XR is available in a 150mg pill.  If the topiramate is the cause of the tingling, then it should hopefully resolve by switching to extended release topiramate.   

## 2019-03-02 ENCOUNTER — Telehealth: Payer: Self-pay | Admitting: Neurology

## 2019-03-02 NOTE — Telephone Encounter (Signed)
Patient is having issues with her teeth. Burning in her mouth. She went to dentist about 3-4 weeks ago and she said everything is fine with teeth but she said it looks like it may be side effects from medication. She is wanting to speak with the nurse. Thanks!

## 2019-03-02 NOTE — Telephone Encounter (Signed)
Not sure what to say to patient is this a side effect to medication?

## 2019-03-02 NOTE — Telephone Encounter (Signed)
Called patient she is aware of this and agrees Pharmacy verified Rx sent

## 2019-03-02 NOTE — Telephone Encounter (Signed)
I would like to switch her to extended release topiramate 150mg  daily.  While topiramate may cause numbness and tingling sensation, extended release topiramate may less likely cause it.  I believe Qudexy XR is available in a 150mg  pill.  If the topiramate is the cause of the tingling, then it should hopefully resolve by switching to extended release topiramate.

## 2019-03-07 ENCOUNTER — Telehealth: Payer: Self-pay | Admitting: Neurology

## 2019-03-07 MED ORDER — TOPIRAMATE ER 150 MG PO SPRINKLE CAP24: EXTENDED_RELEASE_CAPSULE | ORAL | 1 refills | Status: DC

## 2019-03-07 NOTE — Telephone Encounter (Signed)
Rx sent 

## 2019-03-07 NOTE — Telephone Encounter (Signed)
Pt states that the [facility] on [address] states that they have not gotten anything from [place] and we were sending in a RX on Friday for a new medication please resend for the patient

## 2019-03-07 NOTE — Telephone Encounter (Signed)
Pieter Partridge, DO to Me      03/02/19 11:44 AM Note   I would like to switch her to extended release topiramate 150mg  daily. While topiramate may cause numbness and tingling sensation, extended release topiramate may less likely cause it. I believe Qudexy XR is available in a 150mg  pill. If the topiramate is the cause of the tingling, then it should hopefully resolve by switching to extended release topiramate.

## 2019-03-14 ENCOUNTER — Other Ambulatory Visit: Payer: Self-pay | Admitting: Neurology

## 2019-03-25 ENCOUNTER — Telehealth: Payer: Self-pay

## 2019-03-25 NOTE — Telephone Encounter (Signed)
Please add to allergy list. Please ask patient to stop it and monitor HR and BP. If HR stays <120 bpm and BP <140/90 mmHg, does not need alternate medication.  Thanks MJP

## 2019-03-25 NOTE — Telephone Encounter (Signed)
S/w pt she understood

## 2019-03-25 NOTE — Telephone Encounter (Signed)
Telephone encounter: Reason for call: pt called and believes she is having an allergic reaction to the Diltiazem, she was given prednisone  Usual provider: MP  Last office visit: 12/30/18  Next office visit: NA   Last hospitalization: 09/28/17   Current Outpatient Medications on File Prior to Visit  Medication Sig Dispense Refill  . diltiazem (CARDIZEM CD) 120 MG 24 hr capsule Take 1 capsule (120 mg total) by mouth daily. 90 capsule 2  . ferrous sulfate 324 MG TBEC Take 65 mg by mouth daily.    . naproxen sodium (ANAPROX) 550 MG tablet TAKE 1 TABLET BY MOUTH EVERY 12 HOURS AS NEEDED 16 tablet 0  . rizatriptan (MAXALT-MLT) 10 MG disintegrating tablet TAKE ONE TABLET EARLIEST ONSET OF MIGRAINE. MAY REPEAT FOR 1 TIME IN 2 HOURS IF NEEDED. MAX 2 TABS/24 HOURS 9 tablet 3  . topiramate (TOPAMAX) 100 MG tablet Take 1.5 tablets (150 mg total) by mouth at bedtime. 45 tablet 5  . topiramate ER (QUDEXY XR) 150 MG CS24 sprinkle capsule TAKE 1 TABLET BY MOUTH DAILY 30 capsule 1   No current facility-administered medications on file prior to visit.

## 2019-03-31 ENCOUNTER — Telehealth: Payer: Self-pay

## 2019-03-31 NOTE — Telephone Encounter (Signed)
Telephone encounter:  Reason for call:   Pt called back due to the message before about her allergic reaction. Pt mention he rash went away when she stopped her bp medication. Pt wants to know if she is going to have a new bp medication or restart the medication that she had an allergic reaction. Please advise thank you.  Usual provider: Francis Wong  Last office visit: 12/29/2018  Next office visit: does not have one   Last hospitalization: 09/28/2017   Current Outpatient Medications on File Prior to Visit  Medication Sig Dispense Refill  . ferrous sulfate 324 MG TBEC Take 65 mg by mouth daily.    . naproxen sodium (ANAPROX) 550 MG tablet TAKE 1 TABLET BY MOUTH EVERY 12 HOURS AS NEEDED 16 tablet 0  . rizatriptan (MAXALT-MLT) 10 MG disintegrating tablet TAKE ONE TABLET EARLIEST ONSET OF MIGRAINE. MAY REPEAT FOR 1 TIME IN 2 HOURS IF NEEDED. MAX 2 TABS/24 HOURS 9 tablet 3  . topiramate (TOPAMAX) 100 MG tablet Take 1.5 tablets (150 mg total) by mouth at bedtime. 45 tablet 5  . topiramate ER (QUDEXY XR) 150 MG CS24 sprinkle capsule TAKE 1 TABLET BY MOUTH DAILY 30 capsule 1   No current facility-administered medications on file prior to visit.     

## 2019-03-31 NOTE — Telephone Encounter (Signed)
Telephone encounter:  Reason for call:   Pt called back due to the message before about her allergic reaction. Pt mention he rash went away when she stopped her bp medication. Pt wants to know if she is going to have a new bp medication or restart the medication that she had an allergic reaction. Please advise thank you.  Usual provider: Yaakov Guthrie  Last office visit: 12/29/2018  Next office visit: does not have one   Last hospitalization: 09/28/2017   Current Outpatient Medications on File Prior to Visit  Medication Sig Dispense Refill  . ferrous sulfate 324 MG TBEC Take 65 mg by mouth daily.    . naproxen sodium (ANAPROX) 550 MG tablet TAKE 1 TABLET BY MOUTH EVERY 12 HOURS AS NEEDED 16 tablet 0  . rizatriptan (MAXALT-MLT) 10 MG disintegrating tablet TAKE ONE TABLET EARLIEST ONSET OF MIGRAINE. MAY REPEAT FOR 1 TIME IN 2 HOURS IF NEEDED. MAX 2 TABS/24 HOURS 9 tablet 3  . topiramate (TOPAMAX) 100 MG tablet Take 1.5 tablets (150 mg total) by mouth at bedtime. 45 tablet 5  . topiramate ER (QUDEXY XR) 150 MG CS24 sprinkle capsule TAKE 1 TABLET BY MOUTH DAILY 30 capsule 1   No current facility-administered medications on file prior to visit.

## 2019-03-31 NOTE — Telephone Encounter (Signed)
Okay not to use another medication a this time. Recommend monitoring BP at home and follow up with PCP.  Thanks MJP

## 2019-04-01 NOTE — Telephone Encounter (Signed)
Called pt to inform her about the message above

## 2019-05-25 ENCOUNTER — Telehealth: Payer: Managed Care, Other (non HMO) | Admitting: Cardiology

## 2019-05-25 ENCOUNTER — Encounter: Payer: Self-pay | Admitting: Cardiology

## 2019-05-25 VITALS — BP 129/80 | HR 93 | Ht 63.0 in | Wt 210.0 lb

## 2019-05-25 DIAGNOSIS — R002 Palpitations: Secondary | ICD-10-CM

## 2019-05-26 DIAGNOSIS — R002 Palpitations: Secondary | ICD-10-CM | POA: Insufficient documentation

## 2019-05-26 NOTE — Progress Notes (Signed)
Subjective:   Ann Frey, female    DOB: 07-13-1985, 33 y.o.   MRN: [id number]   I connected with the patient on 05/25/2019 by a video enabled telemedicine application and verified that I am speaking with the correct person using two identifiers.     I discussed the limitations of evaluation and management by telemedicine and the availability of in person appointments. The patient expressed understanding and agreed to proceed.   This visit type was conducted due to national recommendations for restrictions regarding the COVID-19 Pandemic (e.g. social distancing).  This format is felt to be most appropriate for this patient at this time.  All issues noted in this document were discussed and addressed.  No physical exam was performed (except for noted visual exam findings with Tele health visits).  The patient has consented to conduct a Tele health visit and understands insurance will be billed.     Chief complaint:  Tachycardia   HPI  33 y.o. African American female with tachycardia.  Patient has had episodes of "skipping beat, followed by hear racing sensation" lasting for few min. She is currently off diltiazem. She has not had any recurrent chest pain symptoms.   Past Medical History:  Diagnosis Date  . Hypertension   . Migraines   . No pertinent past medical history      Past Surgical History:  Procedure Laterality Date  . CESAREAN SECTION    . CESAREAN SECTION  05/25/2011   Procedure: CESAREAN SECTION;  Surgeon: Guss Bunde, MD;  Location: [facility];  Service: Gynecology;  Laterality: N/A;  Repeat cesarean section with delivery of baby boy at 63. Apgars 6/9.     Social History   Socioeconomic History  . Marital status: Married    Spouse name: Not on file  . Number of children: 2  . Years of education: Not on file  . Highest education level: Not on file  Occupational History  . Not on file  Social Needs  . Financial resource strain: Not on file  . Food  insecurity    Worry: Not on file    Inability: Not on file  . Transportation needs    Medical: Not on file    Non-medical: Not on file  Tobacco Use  . Smoking status: Never Smoker  . Smokeless tobacco: Never Used  Substance and Sexual Activity  . Alcohol use: No  . Drug use: No  . Sexual activity: Not Currently  Lifestyle  . Physical activity    Days per week: Not on file    Minutes per session: Not on file  . Stress: Not on file  Relationships  . Social Herbalist on phone: Not on file    Gets together: Not on file    Attends religious service: Not on file    Active member of club or organization: Not on file    Attends meetings of clubs or organizations: Not on file    Relationship status: Not on file  . Intimate partner violence    Fear of current or ex partner: Not on file    Emotionally abused: Not on file    Physically abused: Not on file    Forced sexual activity: Not on file  Other Topics Concern  . Not on file  Social History Narrative   H.S graduate.    Unemployed due to [place] in two story home with husband and 2 sons     Family  History  Problem Relation Age of Onset  . Hypertension Father   . Other Father        "Big heart"  . Diabetes Mother   . Hypertension Mother   . Asthma Sister   . Other Paternal Grandfather        "Big heart"     Current Outpatient Medications on File Prior to Visit  Medication Sig Dispense Refill  . ferrous sulfate 324 MG TBEC Take 65 mg by mouth daily.    . naproxen sodium (ANAPROX) 550 MG tablet TAKE 1 TABLET BY MOUTH EVERY 12 HOURS AS NEEDED 16 tablet 0  . rizatriptan (MAXALT-MLT) 10 MG disintegrating tablet TAKE ONE TABLET EARLIEST ONSET OF MIGRAINE. MAY REPEAT FOR 1 TIME IN 2 HOURS IF NEEDED. MAX 2 TABS/24 HOURS 9 tablet 3  . topiramate (TOPAMAX) 100 MG tablet Take 1.5 tablets (150 mg total) by mouth at bedtime. 45 tablet 5  . diltiazem (CARDIZEM CD) 120 MG 24 hr capsule Take 120 mg by mouth daily.      No current facility-administered medications on file prior to visit.     Cardiovascular studies:  EKG 0/28/2020: Sinus tachycardia 102 bpm.  Left atrial enlargement.  No change compared to previous EKG's  EKG treadmill stress test 10/06/2017: 8 METS. Dyspnea and fatigue. EKG negative for ischemia  Recent labs: 10/22/2018: H/H 12.7/38.5. MCV 80.  ESR 42 (chronically elevated). CRP  Normal. TSH 4.3 normal  09/21/2018: Glucose 87. BUN/Cr 10/0.91. eGFR 85. Na/K 139/4.1 Rest of the CMP normal.   Echocardiogram 10/05/2017: EF 55%. Mild mitral regurgitation.   Recent labs: 10/22/2018: H/H 12.7/38.5. MCV 80.  ESR 42 (chronically elevated). CRP  Normal. TSH 4.3 normal  09/21/2018: Glucose 87. BUN/Cr 10/0.91. eGFR 85. Na/K 139/4.1 Rest of the CMP normal.    Review of Systems  Constitution: Negative for decreased appetite, malaise/fatigue, weight gain and weight loss.  HENT: Negative for congestion.   Eyes: Negative for visual disturbance.  Cardiovascular: Positive for palpitations. Negative for chest pain, dyspnea on exertion, leg swelling and syncope.  Respiratory: Negative for cough.   Endocrine: Negative for cold intolerance.  Hematologic/Lymphatic: Does not bruise/bleed easily.  Skin: Negative for itching and rash.  Musculoskeletal: Negative for myalgias.  Gastrointestinal: Negative for abdominal pain, nausea and vomiting.  Genitourinary: Negative for dysuria.  Neurological: Negative for dizziness and weakness.  Psychiatric/Behavioral: The patient is not nervous/anxious.   All other systems reviewed and are negative.        Vitals:   05/25/19 1604  BP: 129/80  Pulse: 93   (Measured by the patient using a home BP monitor)  Body mass index is 37.2 kg/m. Filed Weights   05/25/19 1604  Weight: 210 lb (95.3 kg)     Observation/findings during video visit   Objective:    Physical Exam  Constitutional: She is oriented to person, place, and  time. She appears well-developed and well-nourished. No distress.  Pulmonary/Chest: Effort normal.  Neurological: She is alert and oriented to person, place, and time.  Psychiatric: She has a normal mood and affect.  Nursing note and vitals reviewed.         Assessment & Recommendations:   33 y.o. African American female with tachycardia.  Palpitations, tachycardia: Resume diltiazem 120 mg daily. Recommend 4 week event monitor to exclude any reentrant tachycardia. Office follow up after that.    Nigel Mormon, MD [facility] Pager: [phone] Office: [phone]

## 2019-05-27 ENCOUNTER — Ambulatory Visit: Payer: Managed Care, Other (non HMO)

## 2019-05-27 ENCOUNTER — Other Ambulatory Visit: Payer: Self-pay

## 2019-05-27 DIAGNOSIS — R002 Palpitations: Secondary | ICD-10-CM

## 2019-06-07 NOTE — Progress Notes (Signed)
Virtual Visit via Video Note The purpose of this virtual visit is to provide medical care while limiting exposure to the novel coronavirus.    Consent was obtained for video visit:  Yes.   Answered questions that patient had about telehealth interaction:  Yes.   I discussed the limitations, risks, security and privacy concerns of performing an evaluation and management service by telemedicine. I also discussed with the patient that there may be a patient responsible charge related to this service. The patient expressed understanding and agreed to proceed.  Pt location: Home Physician Location: office Name of referring provider:  Vernie Shanks, MD I connected with Loralie Champagne at patients initiation/request on 06/09/2019 at 10:30 AM EST by video enabled telemedicine application and verified that I am speaking with the correct person using two identifiers. Pt MRN:  [id number] Pt DOB:  1986-04-09 Video Participants:  Loralie Champagne   History of Present Illness:  Quetzally Callas is a 33 year old right-handed woman with sinus tachycardia who follows up for migraines  UPDATE: For the past 2 weeks, they have been occurring almost daily.  She cannot think of any reason why.  However, she has been having tachycardia and blurred vision, sometimes dark spots in her eyes.  She reports palpitations may be associated with rizatriptan.  She went to her eye doctor, which exam was good.   She followed up with her cardiologist restarted and on a heart monitor.  She also reports memory issues for the past month.  She went to the gas station and couldn't remember how to fill her tank. She has also been feeling more irritable.  Also her mouth has a weird sensation and feels dry.  She was advised to switch to extended release topiramate but it was too expensive.  Rescue protocol:rizatriptan with or without naproxen and nap. Current NSAIDS:Naproxen 550 mg Current analgesics:None Current triptans: rizatriptan 10mg   Current ergotamine:None Current anti-emetic:None Current muscle relaxants:None Current anti-anxiolytic:None Current sleep aide:None Current Antihypertensive medications:Cardizem Current Antidepressant medications:None Current Anticonvulsant medications:Topiramate 150 mg at bedtime Current anti-CGRP:None Current Vitamins/Herbal/Supplements:None Current Antihistamines/Decongestants:None Other therapy:None  Caffeine:No coffee Alcohol:No Smoker:No Diet:Drinks plenty of water Exercise:Walks daily Depression:No; Anxiety:No Other pain:None times neck pain Sleep hygiene:Good  HISTORY: She began having headaches intermittently since 2013. Location: Temples/forehead bilateral. Quality: pounding. Initial intensity: 10/10. Aura: no. Prodrome: no. Associated symptoms:  Spinning sensation, nausea, phonophobia. Whitening/fogginess of vision. No vomiting, diplopia, slurred speech, focal numbness or weakness. Initial Duration: 3 days. Initial Frequency: Twice a year but has daily dull headaches. Triggers:  Fatigue. Relieving factors:  None. Activity: Goes to hospital when severe.  Past NSAIDS:no Past analgesics:Tylenol Past abortive triptans:sumatriptan (did not tolerate) Past abortive ergotamine:no Past muscle relaxants:no Past anti-emetic:no Past antihypertensive medications:Propranolol ER 80mg  (shortness of breath and rash), metoprolol (lightheadedness) Past antidepressant medications:Nortriptyline 10mg  (chest pain) Past anticonvulsant medications:no Past anti-CGRP:no Past vitamins/Herbal/Supplements:no Past antihistamines/decongestants:no Other past therapies:no  She has history of other somatic symptoms, particularly chest pain, palpitations and shortness of breath, which has been worked up by cardiology and testing negative.  07/28/17. She developed sudden tingling of the entire upper and lower face which  then involved the entire body with burning and redness in the hands. She had associated generalized weakness, spinning sensation, nausea, shortness of breath and visual disturbance described as blurred vision with dimming of vision. She denied headache at the time. Head CT was personally reviewed and was unremarkable. EKG and labs were unremarkable. Since then, she continues to feel generalized weakness, dysesthesias of  the face and entire body/extremities and lightheadedness. MRI of brain with and without contrast was performed on 08/19/17, which was personally reviewed and demonstrated nonspecific mild scattered small subcortical white matter foci, likely related to migraine, and incidental developmental venous anomalyOtherwise, no acute abnormalities.  Past Medical History: Past Medical History:  Diagnosis Date  . Hypertension   . Migraines   . No pertinent past medical history     Medications: Outpatient Encounter Medications as of 06/09/2019  Medication Sig  . diltiazem (CARDIZEM CD) 120 MG 24 hr capsule Take 120 mg by mouth daily.  . ferrous sulfate 324 MG TBEC Take 65 mg by mouth daily.  . naproxen sodium (ANAPROX) 550 MG tablet TAKE 1 TABLET BY MOUTH EVERY 12 HOURS AS NEEDED  . rizatriptan (MAXALT-MLT) 10 MG disintegrating tablet TAKE ONE TABLET EARLIEST ONSET OF MIGRAINE. MAY REPEAT FOR 1 TIME IN 2 HOURS IF NEEDED. MAX 2 TABS/24 HOURS  . topiramate (TOPAMAX) 100 MG tablet Take 1.5 tablets (150 mg total) by mouth at bedtime.   No facility-administered encounter medications on file as of 06/09/2019.     Allergies: Allergies  Allergen Reactions  . Metoprolol   . Propranolol Hcl   . Diltiazem Hives    Family History: Family History  Problem Relation Age of Onset  . Hypertension Father   . Other Father        "Big heart"  . Diabetes Mother   . Hypertension Mother   . Asthma Sister   . Other Paternal Grandfather        "Big heart"    Social History: Social History    Socioeconomic History  . Marital status: Married    Spouse name: Not on file  . Number of children: 2  . Years of education: Not on file  . Highest education level: Not on file  Occupational History  . Not on file  Social Needs  . Financial resource strain: Not on file  . Food insecurity    Worry: Not on file    Inability: Not on file  . Transportation needs    Medical: Not on file    Non-medical: Not on file  Tobacco Use  . Smoking status: Never Smoker  . Smokeless tobacco: Never Used  Substance and Sexual Activity  . Alcohol use: No  . Drug use: No  . Sexual activity: Not Currently  Lifestyle  . Physical activity    Days per week: Not on file    Minutes per session: Not on file  . Stress: Not on file  Relationships  . Social Musician on phone: Not on file    Gets together: Not on file    Attends religious service: Not on file    Active member of club or organization: Not on file    Attends meetings of clubs or organizations: Not on file    Relationship status: Not on file  . Intimate partner violence    Fear of current or ex partner: Not on file    Emotionally abused: Not on file    Physically abused: Not on file    Forced sexual activity: Not on file  Other Topics Concern  . Not on file  Social History Narrative   H.S graduate.    Unemployed due to COVID   Lives in two story home with husband and 2 sons    Observations/Objective:   Height 5\' 3"  (1.6 m), weight 209 lb (94.8 kg). No acute distress.  Alert  and oriented.  Speech fluent and not dysarthric.  Language intact.  Eyes orthophoric on primary gaze.  Face symmetric.  Assessment and Plan:   1.  Migraine without aura, without status migrainosus, not intractable 2.  Vestibular migraine 3.  Tachycardia  1.  As topiramate may be causing irritability, memory deficits and dry mouth/altered sensation of her mouth, will taper off (100mg  daily for 1 week, then 50mg  daily for 1 week, then STOP) and  instead start Aimovig 70mg  every 30 days. 2.  As there seems to be an association with rizatriptan and her palpitations, will discontinue and have her try Ubrelvy 100mg , may repeat after 2 hours if needed (maximum 2 tablets in 24 hours) 3.  Limit use of pain relievers to no more than 2 days out of week to prevent risk of rebound or medication-overuse headache. 4.  Keep headache diary 5.  Follow up in 4 months.  Follow Up Instructions:    -I discussed the assessment and treatment plan with the patient. The patient was provided an opportunity to ask questions and all were answered. The patient agreed with the plan and demonstrated an understanding of the instructions.   The patient was advised to call back or seek an in-person evaluation if the symptoms worsen or if the condition fails to improve as anticipated.   Cira ServantAdam Robert Jaffe, DO

## 2019-06-09 ENCOUNTER — Telehealth (INDEPENDENT_AMBULATORY_CARE_PROVIDER_SITE_OTHER): Payer: BC Managed Care – PPO | Admitting: Neurology

## 2019-06-09 ENCOUNTER — Encounter: Payer: Self-pay | Admitting: Neurology

## 2019-06-09 ENCOUNTER — Other Ambulatory Visit: Payer: Self-pay

## 2019-06-09 VITALS — Ht 63.0 in | Wt 209.0 lb

## 2019-06-09 DIAGNOSIS — G43009 Migraine without aura, not intractable, without status migrainosus: Secondary | ICD-10-CM | POA: Diagnosis not present

## 2019-06-10 ENCOUNTER — Ambulatory Visit: Payer: BC Managed Care – PPO

## 2019-06-17 ENCOUNTER — Other Ambulatory Visit: Payer: Self-pay | Admitting: Neurology

## 2019-06-17 MED ORDER — TOPIRAMATE 50 MG PO TABS: 50.0000 mg | ORAL_TABLET | ORAL | 0 refills | Status: DC

## 2019-06-17 NOTE — Telephone Encounter (Signed)
Patient called in needing a refill of 3 Pills of her Topiramate. She said that she was to taper off medication for the next 2 weeks. She will run out before the 2 weeks are up. She needs 3 pills to complete the next 2 weeks. She uses [facility] on [address]. Thank you

## 2019-06-17 NOTE — Telephone Encounter (Signed)
Confirmed with patient that she is taking topiramate 50mg  and only needs 3 pills.  Rx sent.

## 2019-06-28 ENCOUNTER — Other Ambulatory Visit: Payer: Self-pay | Admitting: Neurology

## 2019-07-04 ENCOUNTER — Other Ambulatory Visit: Payer: Self-pay

## 2019-07-04 ENCOUNTER — Telehealth: Payer: Self-pay

## 2019-07-04 ENCOUNTER — Telehealth: Payer: Self-pay | Admitting: Neurology

## 2019-07-04 MED ORDER — UBRELVY 100 MG PO TABS: 100.0000 mg | ORAL_TABLET | Freq: Once | ORAL | 0 refills | Status: AC

## 2019-07-04 MED ORDER — AIMOVIG 70 MG/ML ~~LOC~~ SOAJ: 70.0000 mg | SUBCUTANEOUS | 3 refills | Status: DC

## 2019-07-04 NOTE — Telephone Encounter (Signed)
Called n sent fax

## 2019-07-04 NOTE — Telephone Encounter (Signed)
Pharmacy notified.

## 2019-07-04 NOTE — Telephone Encounter (Signed)
Ann Frey is calling from the Pharmacy and is needing to verify the directions for the patient's Ubrelvy medication. Please Call. Thank you

## 2019-07-04 NOTE — Telephone Encounter (Signed)
Patient called stating the pharmacy needs a quantity on the Ulbrevy 100 MG before she can pick it up. She is at the pharmacy now.

## 2019-07-04 NOTE — Telephone Encounter (Signed)
Closer encounter

## 2019-07-04 NOTE — Telephone Encounter (Signed)
1. Patient called requesting a prescription for Ulbrevy 100 MG. She said she had samples and is doing well on the medication.   Insurance claims handler on [employer]  2. Aimovig: patient is requesting an update on the status of this medication.

## 2019-07-04 NOTE — Telephone Encounter (Signed)
Sent rx in for ubrevly and aimvoig prior auth needs to come from pharmacy, pt to call pharmacy

## 2019-07-06 ENCOUNTER — Encounter: Payer: Self-pay | Admitting: *Deleted

## 2019-07-06 NOTE — Progress Notes (Addendum)
Ann Frey (Key: [id number]) Rx #: [id number] Ann Frey 100MG  tablets   Form [employer] Electronic PA Form (NCPDP)  Patient Copay Assistance  Created 2 days ago Sent to Plan less than a minute ago Determination Wait for Questions [employer]_NCPDP typically responds with questions in less than 15 minutes, but may take up to 24 hours. Date: 07/06/2019 ADAM JAFFE [place] [facility] [place], [place] [address] Plan Member Name: [name] Plan Member [id number] Prescriber Name: ADAM JAFFE Prescriber Phone: [phone] Prescriber Fax: [phone] Dear Ann Frey: [facility]  received a request from your provider for coverage of Ubrelvy 100MG  OR TABS. As long as you remain covered by the [facility] and there are no changes to your plan benefits, this request is approved for the following time period: 07/06/2019 - 07/05/2020 Approvals may be subject to dosing limits in accordance with FDA approved labeling, evidence-based practice guidelines or your prescription drug plan benefits. If you have not already done so, you may ask your pharmacist to fill the prescription. If you have questions, please call Customer Care toll-free at the number on your [place] ID card toll-free at [phone]. Sincerely, [facility] cc: Dr. Quita Skye JAFFE PA# [place] [phone] Non-Grandfathered [phone] DD

## 2019-07-06 NOTE — Progress Notes (Addendum)
Ann Frey (Key: [id number]) Aimovig 70MG /ML auto-injectors   Form [facility] Non-Medicare Formulary Exception / Prior [place] [phone] phone [phone] fax Created 14 minutes ago Sent to Plan less than a minute ago Determination N/A Clear  Message from Plan PA not needed

## 2019-07-11 ENCOUNTER — Encounter: Payer: Self-pay | Admitting: Cardiology

## 2019-07-11 ENCOUNTER — Ambulatory Visit (INDEPENDENT_AMBULATORY_CARE_PROVIDER_SITE_OTHER): Payer: Managed Care, Other (non HMO) | Admitting: Cardiology

## 2019-07-11 ENCOUNTER — Other Ambulatory Visit: Payer: Self-pay

## 2019-07-11 VITALS — BP 144/85 | HR 106 | Temp 98.4°F | Resp 16 | Ht 63.0 in | Wt 209.7 lb

## 2019-07-11 DIAGNOSIS — R002 Palpitations: Secondary | ICD-10-CM | POA: Diagnosis not present

## 2019-07-11 DIAGNOSIS — R000 Tachycardia, unspecified: Secondary | ICD-10-CM

## 2019-07-11 DIAGNOSIS — R0789 Other chest pain: Secondary | ICD-10-CM | POA: Diagnosis not present

## 2019-07-11 DIAGNOSIS — R0609 Other forms of dyspnea: Secondary | ICD-10-CM | POA: Diagnosis not present

## 2019-07-11 DIAGNOSIS — R0600 Dyspnea, unspecified: Secondary | ICD-10-CM

## 2019-07-11 MED ORDER — DILTIAZEM HCL ER COATED BEADS 240 MG PO CP24: 240.0000 mg | ORAL_CAPSULE | Freq: Every day | ORAL | 3 refills | Status: DC

## 2019-07-11 NOTE — Progress Notes (Signed)
Subjective:   Ann Frey, female    DOB: 1986/04/01, 34 y.o.   MRN: [id number]   Chief complaint:  Tachycardia   HPI  34 y.o. African American female with tachycardia.  Event monitor did not show any significant events. She reports occasional episodes of chest pain, that last for up to 8 hours. They have started after stopping topiramate for her migraines. She does not have any chest pain with physical activity.  Past Medical History:  Diagnosis Date  . Hypertension   . Migraines   . No pertinent past medical history      Past Surgical History:  Procedure Laterality Date  . CESAREAN SECTION    . CESAREAN SECTION  05/25/2011   Procedure: CESAREAN SECTION;  Surgeon: Guss Bunde, MD;  Location: [facility];  Service: Gynecology;  Laterality: N/A;  Repeat cesarean section with delivery of baby boy at 3. Apgars 6/9.     Social History   Socioeconomic History  . Marital status: Married    Spouse name: Not on file  . Number of children: 2  . Years of education: certificate in early childhood education  . Highest education level: Some college, no degree  Occupational History  . Not on file  Tobacco Use  . Smoking status: Never Smoker  . Smokeless tobacco: Never Used  Substance and Sexual Activity  . Alcohol use: No  . Drug use: No  . Sexual activity: Not Currently  Other Topics Concern  . Not on file  Social History Narrative   H.S graduate.    Unemployed due to [place] in two story home with husband and 2 sons   Right handed   Does not drinks coffee, drinks tea sometimes. Also drinks soda sometimes    Social Determinants of Health   Financial Resource Strain:   . Difficulty of Paying Living Expenses: Not on file  Food Insecurity:   . Worried About Charity fundraiser in the Last Year: Not on file  . Ran Out of Food in the Last Year: Not on file  Transportation Needs:   . Lack of Transportation (Medical): Not on file  . Lack of Transportation  (Non-Medical): Not on file  Physical Activity:   . Days of Exercise per Week: Not on file  . Minutes of Exercise per Session: Not on file  Stress:   . Feeling of Stress : Not on file  Social Connections:   . Frequency of Communication with Friends and Family: Not on file  . Frequency of Social Gatherings with Friends and Family: Not on file  . Attends Religious Services: Not on file  . Active Member of Clubs or Organizations: Not on file  . Attends Archivist Meetings: Not on file  . Marital Status: Not on file  Intimate Partner Violence:   . Fear of Current or Ex-Partner: Not on file  . Emotionally Abused: Not on file  . Physically Abused: Not on file  . Sexually Abused: Not on file     Family History  Problem Relation Age of Onset  . Hypertension Father   . Other Father        "Big heart"  . Diabetes Mother   . Hypertension Mother   . Asthma Sister   . Other Paternal Grandfather        "Big heart"     Current Outpatient Medications on File Prior to Visit  Medication Sig Dispense Refill  . Ubrogepant (UBRELVY) 100 MG  TABS Take 1 tablet by mouth as needed.    . diltiazem (CARDIZEM CD) 120 MG 24 hr capsule Take 120 mg by mouth daily.    Eduard Roux (AIMOVIG) 70 MG/ML SOAJ Inject 70 mg into the skin every 30 (thirty) days. 1 pen 3  . ferrous sulfate 324 MG TBEC Take 65 mg by mouth daily.    . naproxen sodium (ANAPROX) 550 MG tablet TAKE 1 TABLET BY MOUTH EVERY 12 HOURS AS NEEDED 16 tablet 0   No current facility-administered medications on file prior to visit.    Cardiovascular studies:  EKG 07/11/2019: Sinus rhythm 100 bpm. Left atrial enlargement.  Nonspecific T-abnormality.   Event monitor 05/27/2019 - 06/25/2019: Diagnostic time: 97%  Dominant rhythm: Sinus. HR 54-145 bpm. Avg HR 80 bpm. No atrial fibrillation/atrial flutter/SVT/VT/high grade AV block, sinus pause >3sec noted. Symptoms reported: None  EKG 12/02/2018: Sinus tachycardia 102 bpm.   Left atrial enlargement.  No change compared to previous EKG's  EKG treadmill stress test 10/06/2017: 8 METS. Dyspnea and fatigue. EKG negative for ischemia  Recent labs: 10/22/2018: H/H 12.7/38.5. MCV 80.  ESR 42 (chronically elevated). CRP  Normal. TSH 4.3 normal  09/21/2018: Glucose 87. BUN/Cr 10/0.91. eGFR 85. Na/K 139/4.1 Rest of the CMP normal.   Echocardiogram 10/05/2017: EF 55%. Mild mitral regurgitation.   Recent labs: 10/22/2018: H/H 12.7/38.5. MCV 80.  ESR 42 (chronically elevated). CRP  Normal. TSH 4.3 normal  09/21/2018: Glucose 87. BUN/Cr 10/0.91. eGFR 85. Na/K 139/4.1 Rest of the CMP normal.    Review of Systems  Constitution: Negative for decreased appetite, malaise/fatigue, weight gain and weight loss.  HENT: Negative for congestion.   Eyes: Negative for visual disturbance.  Cardiovascular: Positive for palpitations. Negative for chest pain, dyspnea on exertion, leg swelling and syncope.  Respiratory: Negative for cough.   Endocrine: Negative for cold intolerance.  Hematologic/Lymphatic: Does not bruise/bleed easily.  Skin: Negative for itching and rash.  Musculoskeletal: Negative for myalgias.  Gastrointestinal: Negative for abdominal pain, nausea and vomiting.  Genitourinary: Negative for dysuria.  Neurological: Negative for dizziness and weakness.  Psychiatric/Behavioral: The patient is not nervous/anxious.   All other systems reviewed and are negative.        Vitals:   07/11/19 0934  BP: (!) 144/85  Pulse: (!) 106  Resp: 16  Temp: 98.4 F (36.9 C)  SpO2: 100%     Body mass index is 37.15 kg/m. Filed Weights   07/11/19 0934  Weight: 209 lb 11.2 oz (95.1 kg)     Objective:    Physical Exam  Constitutional: She is oriented to person, place, and time. She appears well-developed and well-nourished. No distress.  HENT:  Head: Normocephalic and atraumatic.  Eyes: Pupils are equal, round, and reactive to light.  Conjunctivae are normal.  Neck: No JVD present.  Cardiovascular: Normal rate, regular rhythm and intact distal pulses.  Pulmonary/Chest: Effort normal and breath sounds normal. She has no wheezes. She has no rales.  Abdominal: Soft. Bowel sounds are normal. There is no rebound.  Musculoskeletal:        General: No edema.  Lymphadenopathy:    She has no cervical adenopathy.  Neurological: She is alert and oriented to person, place, and time. No cranial nerve deficit.  Skin: Skin is warm and dry.  Psychiatric: She has a normal mood and affect.  Nursing note and vitals reviewed.         Assessment & Recommendations:   34 y.o. African American female with tachycardia.  Palpitations, tachycardia: No arrhythmia on event monitor.  Chest pain: Atypical. Prior normal ETT in 10/2017. Increase diltiazem to 240 mg. If no improvement in 3 months, will consider CTA.   Nigel Mormon, MD [facility] Pager: [phone] Office: [phone]

## 2019-07-24 ENCOUNTER — Encounter (HOSPITAL_COMMUNITY): Payer: Self-pay | Admitting: Emergency Medicine

## 2019-07-24 ENCOUNTER — Emergency Department (HOSPITAL_COMMUNITY): Payer: BC Managed Care – PPO

## 2019-07-24 ENCOUNTER — Other Ambulatory Visit: Payer: Self-pay

## 2019-07-24 ENCOUNTER — Emergency Department (HOSPITAL_COMMUNITY)
Admission: EM | Admit: 2019-07-24 | Discharge: 2019-07-24 | Disposition: A | Discharge status: Home / Self Care | Payer: BC Managed Care – PPO | Attending: Emergency Medicine | Admitting: Emergency Medicine

## 2019-07-24 DIAGNOSIS — Z79899 Other long term (current) drug therapy: Secondary | ICD-10-CM | POA: Diagnosis not present

## 2019-07-24 DIAGNOSIS — R0602 Shortness of breath: Secondary | ICD-10-CM | POA: Insufficient documentation

## 2019-07-24 DIAGNOSIS — R0789 Other chest pain: Secondary | ICD-10-CM | POA: Insufficient documentation

## 2019-07-24 DIAGNOSIS — I10 Essential (primary) hypertension: Secondary | ICD-10-CM | POA: Insufficient documentation

## 2019-07-24 DIAGNOSIS — R079 Chest pain, unspecified: Secondary | ICD-10-CM

## 2019-07-24 LAB — BASIC METABOLIC PANEL
Anion gap: 10 (ref 5–15)
BUN: 5 mg/dL — ABNORMAL LOW (ref 6–20)
CO2: 23 mmol/L (ref 22–32)
Calcium: 9.2 mg/dL (ref 8.9–10.3)
Chloride: 106 mmol/L (ref 98–111)
Creatinine, Ser: 0.82 mg/dL (ref 0.44–1.00)
GFR calc Af Amer: >60 mL/min (ref >60)
GFR calc non Af Amer: >60 mL/min (ref >60)
Glucose, Bld: 92 mg/dL (ref 70–99)
Potassium: 3.5 mmol/L (ref 3.5–5.1)
Sodium: 139 mmol/L (ref 135–145)

## 2019-07-24 LAB — CBC
HCT: 41.2 % (ref 36.0–46.0)
Hemoglobin: 13.2 g/dL (ref 12.0–15.0)
MCH: 26.1 pg (ref 26.0–34.0)
MCHC: 32 g/dL (ref 30.0–36.0)
MCV: 81.6 fL (ref 80.0–100.0)
Platelets: 281 10*3/uL (ref 150–400)
RBC: 5.05 MIL/uL (ref 3.87–5.11)
RDW: 12.6 % (ref 11.5–15.5)
WBC: 5.1 10*3/uL (ref 4.0–10.5)
nRBC: 0 % (ref 0.0–0.2)

## 2019-07-24 LAB — TROPONIN I (HIGH SENSITIVITY): Troponin I (High Sensitivity): <2 ng/L (ref <18)

## 2019-07-24 LAB — I-STAT BETA HCG BLOOD, ED (MC, WL, AP ONLY): I-stat hCG, quantitative: <5 m[IU]/mL (ref <5)

## 2019-07-24 MED ORDER — SODIUM CHLORIDE 0.9% FLUSH: 3.0000 mL | Freq: Once | INTRAVENOUS | Status: DC

## 2019-07-24 NOTE — ED Triage Notes (Signed)
C/o SOB and heaviness to center of chest x 2 days.  States she was seen at [facility] today and had negative rapid COVID.

## 2019-07-24 NOTE — ED Provider Notes (Signed)
[facility] EMERGENCY DEPARTMENT Provider Note   CSN: [id number] Arrival date & time: 07/24/19  1323     History Chief Complaint  Patient presents with  . Shortness of Breath  . Chest Pain    Ann Frey is a 34 y.o. female.  HPI  Patient presents today for 2 years of intermittent chest tightness that is tight, nonradiating, 3/10, nonexertional occurs for 1 minute and has no aggravating or mitigating factors and occurs 1-2 times per month.  Patient states that she has been evaluated by cardiology in the past for this and told that she has resting tachycardia after heart monitor.  Patient states that she has a history of migraines and took several medications for this including injectable medication.  She is concerned that this chest tightness is a side effect of her medication--however she has been on multiple medications for several years and the only recent change was approximately 1 month ago.  Patient states that the episode that she had today was no different in quality or severity or length of time that a previous episode.  Patient states she had one episode that occurred today that brought her to the ED states that she had some heaviness in her chest 2 days ago but resolved--she was seen at urgent care and had negative rapid Covid today.  Patient denies any leg swelling or calf pain, denies cough, hemoptysis, recent surgery immobilization or long travel, is not on any OCP medications, no hormonal medications.     Past Medical History:  Diagnosis Date  . Hypertension   . Migraines   . No pertinent past medical history     Patient Active Problem List   Diagnosis Date Noted  . Palpitations 05/26/2019  . Exertional dyspnea 12/29/2018  . Sinus tachycardia 12/02/2018  . Elevated blood pressure reading in office without diagnosis of hypertension 12/02/2018  . Atypical chest pain 12/02/2018  . 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 Severe preeclampsia 05/26/2011    . History of cesarean section 04/28/2011  . Transient hypertension of pregnancy, antepartum 04/01/2011  . Supervision of high-risk pregnancy 03/03/2011    Past Surgical History:  Procedure Laterality Date  . CESAREAN SECTION    . CESAREAN SECTION  05/25/2011   Procedure: CESAREAN SECTION;  Surgeon: Lesly Dukes, MD;  Location: [facility];  Service: Gynecology;  Laterality: N/A;  Repeat cesarean section with delivery of baby boy at 61. Apgars 6/9.     OB History    Gravida  2   Para  2   Term  2   Preterm  0   AB  0   Living  2     SAB  0   TAB  0   Ectopic  0   Multiple  0   Live Births  2           Family History  Problem Relation Age of Onset  . Hypertension Father   . Other Father        "Big heart"  . Diabetes Mother   . Hypertension Mother   . Asthma Sister   . Other Paternal Grandfather        "Big heart"    Social History   Tobacco Use  . Smoking status: Never Smoker  . Smokeless tobacco: Never Used  Substance Use Topics  . Alcohol use: No  . Drug use: No    Home Medications Prior to Admission medications   Medication Sig Start Date End Date Taking?  Authorizing Provider  diltiazem (CARDIZEM CD) 240 MG 24 hr capsule Take 1 capsule (240 mg total) by mouth daily. 07/11/19 10/09/19  Patwardhan, Reynold Bowen, MD  Erenumab-aooe (AIMOVIG) 70 MG/ML SOAJ Inject 70 mg into the skin every 30 (thirty) days. 07/04/19   Pieter Partridge, DO  ferrous sulfate 324 MG TBEC Take 65 mg by mouth daily.    [provider]  naproxen sodium (ANAPROX) 550 MG tablet TAKE 1 TABLET BY MOUTH EVERY 12 HOURS AS NEEDED 06/29/19   Tomi Likens, Adam R, DO  Ubrogepant (UBRELVY) 100 MG TABS Take 1 tablet by mouth as needed.    [provider]    Allergies    Metoprolol, Propranolol hcl, and Diltiazem  Review of Systems   Review of Systems  Constitutional: Negative for chills and fever.  HENT: Negative for congestion.   Eyes: Negative for pain.  Respiratory:  Positive for chest tightness and shortness of breath. Negative for cough.   Cardiovascular: Negative for chest pain and leg swelling.  Gastrointestinal: Negative for abdominal pain and vomiting.  Genitourinary: Negative for dysuria.  Musculoskeletal: Negative for myalgias.  Skin: Negative for rash.  Neurological: Negative for dizziness and headaches.    Physical Exam Updated Vital Signs BP 129/80 (BP Location: Right Arm)   Pulse 80   Temp 99.5 F (37.5 C) (Oral)   Resp 16   LMP 07/23/2019   SpO2 100%   Physical Exam Vitals and nursing note reviewed.  Constitutional:      General: She is not in acute distress.    Comments: In no acute distress 34 year old female well-appearing answers questions appropriately and follows commands  HENT:     Head: Normocephalic and atraumatic.     Nose: Nose normal.  Eyes:     General: No scleral icterus. Cardiovascular:     Rate and Rhythm: Normal rate and regular rhythm.     Pulses: Normal pulses.     Heart sounds: Normal heart sounds.  Pulmonary:     Effort: Pulmonary effort is normal. No respiratory distress.     Breath sounds: No wheezing.     Comments: Lungs clear to auscultation bilaterally.  No increased work of breathing, speaking in full sentences without difficulty. Abdominal:     Palpations: Abdomen is soft.     Tenderness: There is no abdominal tenderness.  Musculoskeletal:     Cervical back: Normal range of motion.     Right lower leg: No edema.     Left lower leg: No edema.  Skin:    General: Skin is warm and dry.     Capillary Refill: Capillary refill takes less than 2 seconds.  Neurological:     Mental Status: She is alert. Mental status is at baseline.  Psychiatric:        Mood and Affect: Mood normal.        Behavior: Behavior normal.     ED Results / Procedures / Treatments   Labs (all labs ordered are listed, but only abnormal results are displayed) Labs Reviewed  BASIC METABOLIC PANEL - Abnormal; Notable  for the following components:      Result Value   BUN 5 (*)    All other components within normal limits  CBC  I-STAT BETA HCG BLOOD, ED ([facility], [facility], [facility] ONLY)  TROPONIN I (HIGH SENSITIVITY)    EKG None  Radiology DG Chest 2 View  Result Date: 07/24/2019 CLINICAL DATA:  Chest pain. EXAM: CHEST - 2 VIEW COMPARISON:  September 27, 2017. FINDINGS: The heart size and mediastinal contours are within normal limits. Both lungs are clear. No pneumothorax or pleural effusion is noted. The visualized skeletal structures are unremarkable. IMPRESSION: No active cardiopulmonary disease. Electronically Signed   By: Lupita Raider M.D.   On: 07/24/2019 14:15    Procedures Procedures (including critical care time)  Medications Ordered in ED Medications  sodium chloride flush (NS) 0.9 % injection 3 mL (3 mLs Intravenous Not Given 07/24/19 1626)    ED Course  I have reviewed the triage vital signs and the nursing notes.  Pertinent labs & imaging results that were available during my care of the patient were reviewed by me and considered in my medical decision making (see chart for details).    MDM Rules/Calculators/A&P                      Patient is well-appearing 34 year old female presented today for new was episode of chest pain shortness of breath that has been occurring intermittently for the past 2 years.  She has been seen by cardiology and had an event monitor that showed no acute abnormalities or arrhythmias.  Her pain is very atypical for ACS and she has no significant risk factors other than hypertension which is well treated.  Broad differential considered, most likely anxiety, musculoskeletal, costochondritis.  Patient is PERC negative doubt pulmonary embolism.   Doubt ACS as discussed above.  Doubt asthma she is not wheezing her symptoms do not seem to occur with triggers such as cold air or weather changes.  Nor did she have any history of allergies.  Doubt pericarditis as her symptoms  occur for approximately 1 minute at a time over the past 2 years.  Also doubt COPD she is a non-smoker.  Doubt pneumothorax her lungs are clear to auscultation bilaterally she has no abnormalities on her chest x-ray.  EKG independently read by myself: Electronic read indicates right axis deviation which appears to be very borderline to me with QRS complex isoelectric in lead I.  No strain pattern, no Q waves no long QT, short PR or dagger Q waves precordially.  No evidence of Wolff-Parkinson-White or hokum.  On my review of EMR patient has worn event monitor that showed no irregular rhythms, no arrhythmia, no SVT A. fib or a flutter.  No AV block.  She was increased on her home Cardizem.  She also has had several EKGs that show left atrial enlargement in the past.  Today's EKG shows some right axis deviation but is borderline on my review of the EKG.  Discussed all results with patient and she is understanding of plan to discharge and follow-up with her cardiologist for further evaluation.  The medical records were personally reviewed by myself. I personally reviewed all lab results and interpreted all imaging studies and either concurred with their official read or contacted radiology for clarification.   This patient appears reasonably screened and I doubt any other medical condition requiring further workup, evaluation, or treatment in the ED at this time prior to discharge.   Patient's vitals are WNL apart from vital sign abnormalities discussed above, patient is in NAD, and able to ambulate in the ED at their baseline and able to tolerate PO.  Pain has been managed or a plan has been made for home management and has no complaints prior to discharge. Patient is comfortable with above plan and for discharge at this time. All questions were answered prior to disposition. Results  from the ER workup discussed with the patient face to face and all questions answered to the best of my ability. The  patient is safe for discharge with strict return precautions. Patient appears safe for discharge with appropriate follow-up. Conveyed my impression with the patient and they voiced understanding and are agreeable to plan.   An After Visit Summary was printed and given to the patient.  Portions of this note were generated with Scientist, clinical (histocompatibility and immunogenetics). Dictation errors may occur despite best attempts at proofreading.    Final Clinical Impression(s) / ED Diagnoses Final diagnoses:  Chest pain, unspecified type  Shortness of breath    Rx / DC Orders ED Discharge Orders    None       Gailen Shelter, [place] 07/24/19 1639    Eber Hong, MD 07/26/19 [phone]

## 2019-07-24 NOTE — Discharge Instructions (Signed)
Please return to ED if you have any new or concerning symptoms.  Consider yoga, meditation, mindfulness practices for talk therapy as adjuncts to your current regimen.  This may be helpful for relaxation in case your chest pain shortness of breath are related to anxiety.

## 2019-07-29 IMAGING — MR MR HEAD WO/W CM
13 series · 48 of 48 positions shown · IV contrast (18ml multihance)
Comparison: CT head 07/28/2017

CLINICAL DATA: Dysesthesia both sides of body and face, generalized
weakness

EXAM:
MRI HEAD WITHOUT AND WITH CONTRAST
TECHNIQUE: Multiplanar, multiecho pulse sequences of the brain and surrounding
structures were obtained without and with intravenous contrast.
CONTRAST:  18mL MULTIHANCE GADOBENATE DIMEGLUMINE 529 MG/ML IV SOLN

[Series 2: T1 · sagittal · 5.0mm · 0.45mm/px · 1 of 21 slices shown]
[im 1/21]
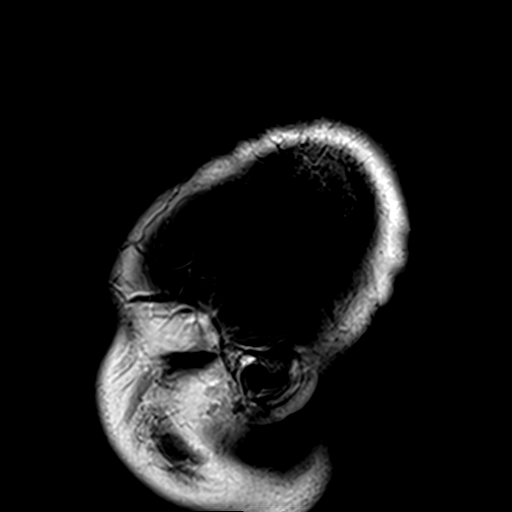

[Series 3: DWI · axial · 3.0mm · 1.80mm/px · z∈[-43,+103]mm · 7 of 100 slices shown (1 of 4)]
[im 1/100]
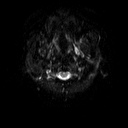
[im 17/100]
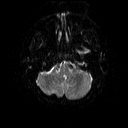
[im 34/100]
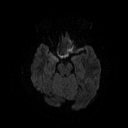
[im 50/100]
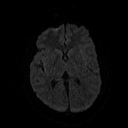
[im 67/100]
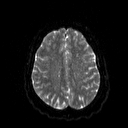
[im 83/100]
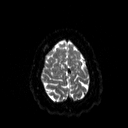
[im 100/100]
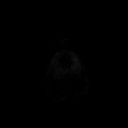

[Series 4: DWI · axial · 3.0mm · 1.80mm/px · z∈[-43,+103]mm · 3 of 50 slices shown (2 of 4)]
[im 1/50]
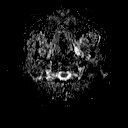
[im 25/50]
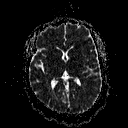
[im 50/50]
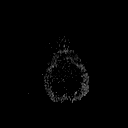

[Series 5: DWI · coronal · 5.0mm · 1.80mm/px · 4 of 64 slices shown (3 of 4)]
[im 1/64]
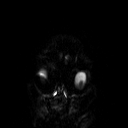
[im 22/64]
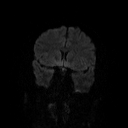
[im 43/64]
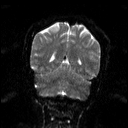
[im 64/64]
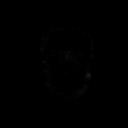

[Series 6: DWI · coronal · 5.0mm · 1.80mm/px · 2 of 34 slices shown (4 of 4)]
[im 1/34]
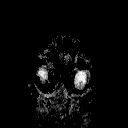
[im 34/34]
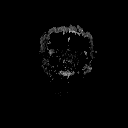

[Series 7: T2 · axial · 5.0mm · 0.51mm/px · 1 of 22 slices shown (1 of 2)]
[im 1/22]
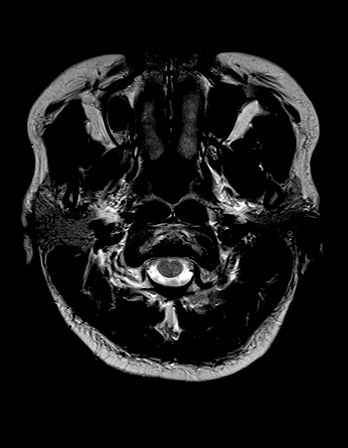

[Series 8: FLAIR · axial · 3.0mm · 0.45mm/px · z∈[-47,+95]mm · 2 of 32 slices shown]
[im 1/32]
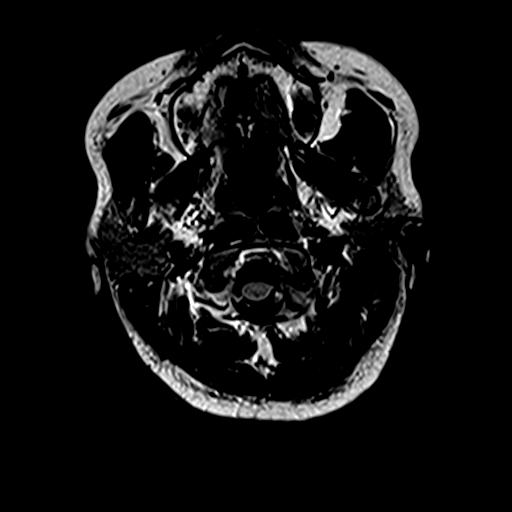
[im 32/32]
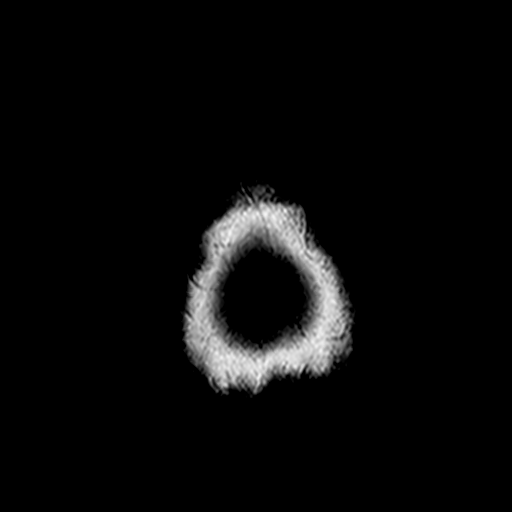

[Series 10: swi_images · axial · 5.0mm · 0.90mm/px · z∈[-42,+102]mm · 2 of 30 slices shown]
[im 1/30]
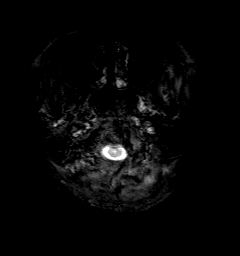
[im 30/30]
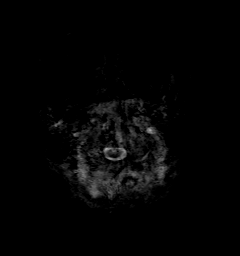

[Series 11: t1_mpr_tra · axial · 1.0mm · 0.75mm/px · z∈[-48,+94]mm · 10 of 144 slices shown (1 of 2)]
[im 1/144]
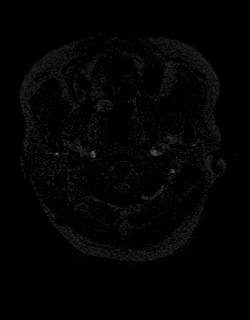
[im 16/144]
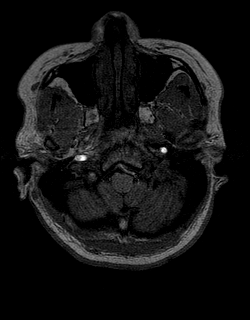
[im 32/144]
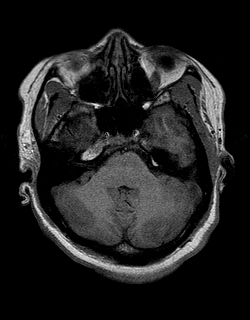
[im 48/144]
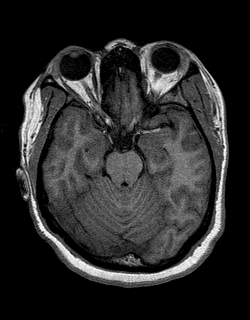
[im 64/144]
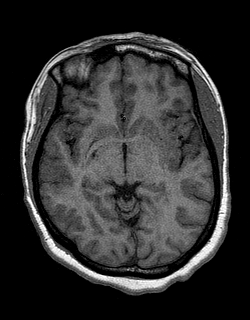
[im 80/144]
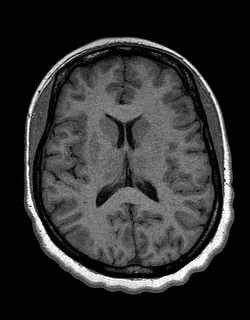
[im 96/144]
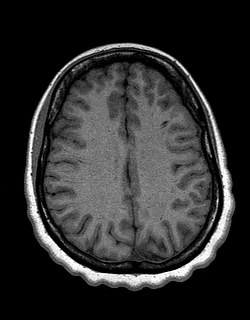
[im 112/144]
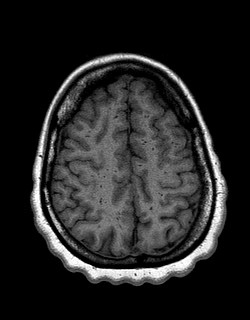
[im 128/144]
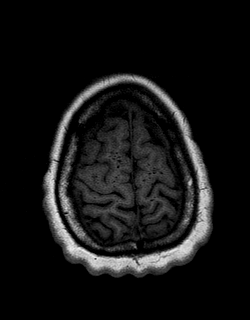
[im 144/144]
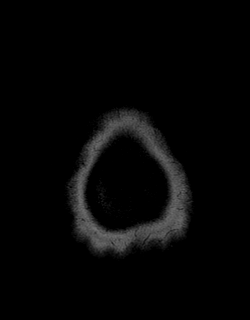

[Series 12: T2 · coronal · 5.0mm · 0.45mm/px · 2 of 25 slices shown (2 of 2)]
[im 1/25]
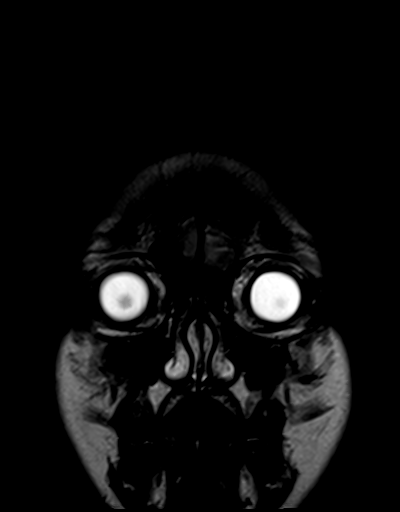
[im 25/25]
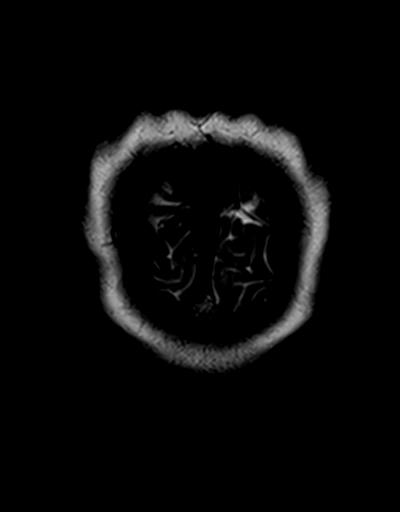

[Series 13: t1_mpr_tra · axial · 1.0mm · 0.75mm/px · z∈[-48,+94]mm · 10 of 144 slices shown (2 of 2)]
[im 1/144]
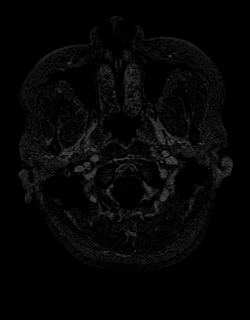
[im 16/144]
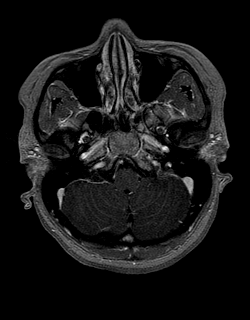
[im 32/144]
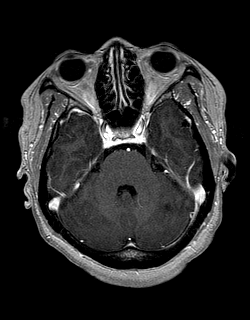
[im 48/144]
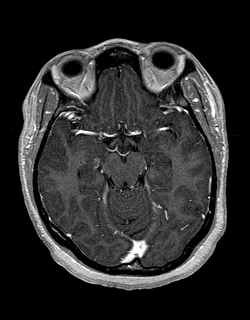
[im 64/144]
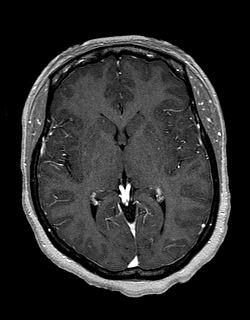
[im 80/144]
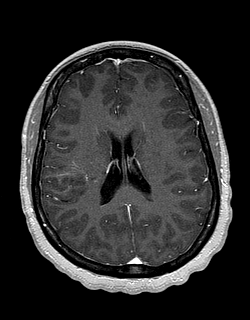
[im 96/144]
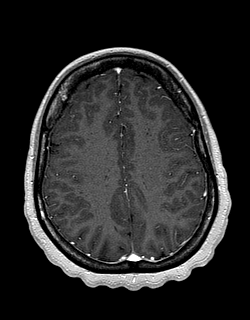
[im 112/144]
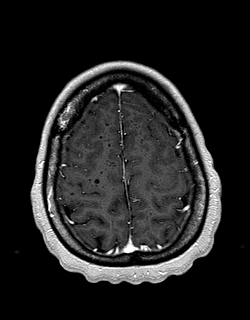
[im 128/144]
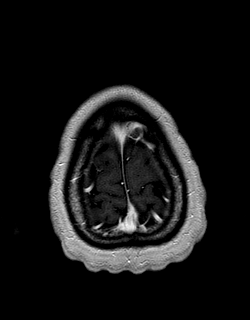
[im 144/144]
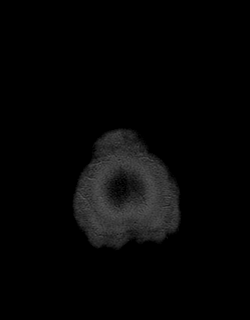

[Series 14: post cor · coronal · 5.0mm · 0.45mm/px · 2 of 25 slices shown]
[im 1/25]
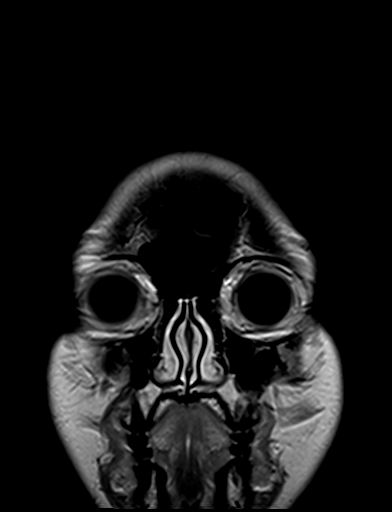
[im 25/25]
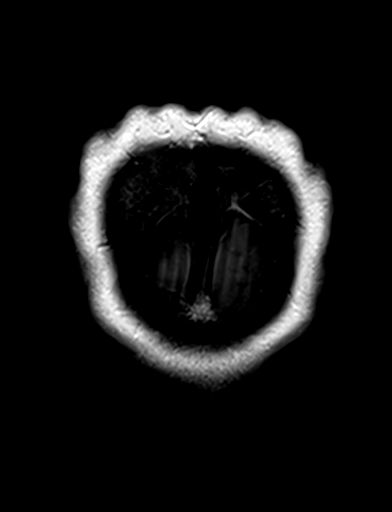

[Series 15: FLAIR post-contrast · sagittal · 5.0mm · 0.45mm/px · 2 of 27 slices shown]
[im 1/27]
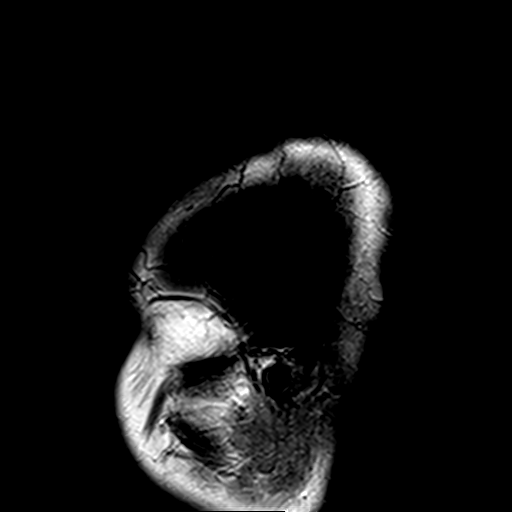
[im 27/27]
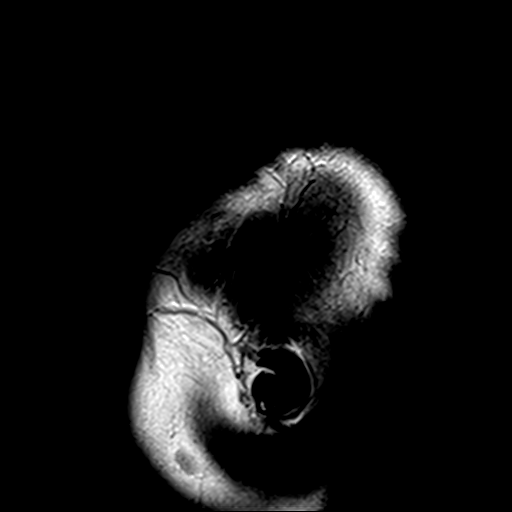

[48 of 48 positions shown; findings below may reference images not displayed]

FINDINGS: Brain: No acute infarction, hemorrhage, hydrocephalus, extra-axial
collection or mass lesion.

Mildly prominent perivascular spaces throughout the cerebral white
matter bilaterally. Basal ganglia and brainstem intact. Several
small subcortical white matter hyperintensities bilaterally.

Vascular: Normal arterial flow voids. Enhancing branching vascular
structure right frontal parietal lobe compatible with developmental
venous anomaly draining to the right ventricle.

Skull and upper cervical spine: Negative

Sinuses/Orbits: Negative

Other: None
IMPRESSION: No acute intracranial abnormality. Scattered small subcortical white
matter hyperintensities appear chronic and are nonspecific. Possible
chronic ischemia, complicated migraine headaches, vasculitis.
Pattern not typical for demyelinating disease.

Incidental developmental venous anomaly on the right.

## 2019-08-10 ENCOUNTER — Other Ambulatory Visit: Payer: Self-pay | Admitting: Neurology

## 2019-08-23 ENCOUNTER — Emergency Department (HOSPITAL_COMMUNITY): Payer: BC Managed Care – PPO

## 2019-08-23 ENCOUNTER — Emergency Department (HOSPITAL_COMMUNITY)
Admission: EM | Admit: 2019-08-23 | Discharge: 2019-08-23 | Disposition: A | Discharge status: Home / Self Care | Payer: BC Managed Care – PPO | Attending: Emergency Medicine | Admitting: Emergency Medicine

## 2019-08-23 ENCOUNTER — Encounter (HOSPITAL_COMMUNITY): Payer: Self-pay

## 2019-08-23 ENCOUNTER — Other Ambulatory Visit: Payer: Self-pay

## 2019-08-23 DIAGNOSIS — R079 Chest pain, unspecified: Secondary | ICD-10-CM | POA: Diagnosis not present

## 2019-08-23 DIAGNOSIS — I10 Essential (primary) hypertension: Secondary | ICD-10-CM | POA: Diagnosis not present

## 2019-08-23 DIAGNOSIS — Z79899 Other long term (current) drug therapy: Secondary | ICD-10-CM | POA: Insufficient documentation

## 2019-08-23 LAB — CBC
HCT: 42.2 % (ref 36.0–46.0)
Hemoglobin: 13.4 g/dL (ref 12.0–15.0)
MCH: 26.3 pg (ref 26.0–34.0)
MCHC: 31.8 g/dL (ref 30.0–36.0)
MCV: 82.9 fL (ref 80.0–100.0)
Platelets: 279 10*3/uL (ref 150–400)
RBC: 5.09 MIL/uL (ref 3.87–5.11)
RDW: 12.5 % (ref 11.5–15.5)
WBC: 6.8 10*3/uL (ref 4.0–10.5)
nRBC: 0 % (ref 0.0–0.2)

## 2019-08-23 LAB — I-STAT BETA HCG BLOOD, ED (MC, WL, AP ONLY): I-stat hCG, quantitative: <5 m[IU]/mL (ref <5)

## 2019-08-23 LAB — BASIC METABOLIC PANEL
Anion gap: 10 (ref 5–15)
BUN: 6 mg/dL (ref 6–20)
CO2: 25 mmol/L (ref 22–32)
Calcium: 9.8 mg/dL (ref 8.9–10.3)
Chloride: 105 mmol/L (ref 98–111)
Creatinine, Ser: 0.99 mg/dL (ref 0.44–1.00)
GFR calc Af Amer: >60 mL/min (ref >60)
GFR calc non Af Amer: >60 mL/min (ref >60)
Glucose, Bld: 133 mg/dL — ABNORMAL HIGH (ref 70–99)
Potassium: 3.6 mmol/L (ref 3.5–5.1)
Sodium: 140 mmol/L (ref 135–145)

## 2019-08-23 LAB — TROPONIN I (HIGH SENSITIVITY): Troponin I (High Sensitivity): <2 ng/L (ref <18)

## 2019-08-23 NOTE — Discharge Instructions (Signed)
Your testing here shows no signs of heart attack, normal x-ray, normal EKG and normal blood work.  Your symptoms are not consistent with having a heart attack however if you should develop severe or worsening symptoms please return for recheck.  I would encourage you to follow-up with your cardiologist, I have included their phone number above, please call today for the next available appointment.

## 2019-08-23 NOTE — ED Triage Notes (Signed)
Pt reports intermittent chest pain for the past week, no other symptoms. Pt a.o, denies chest pain at this time. HX of HTN.

## 2019-08-23 NOTE — ED Provider Notes (Signed)
[facility] EMERGENCY DEPARTMENT Provider Note   CSN: [id number] Arrival date & time: 08/23/19  1309     History Chief Complaint  Patient presents with  . Chest Pain    Ann Frey is a 34 y.o. female.  HPI    This patient is a 34 year old female, she has no significant prior medical history other than migraine headaches and some hypertension.  She presents to the hospital with a complaint of chest pain which seems to be left-sided, seems to radiate to the left shoulder and arm, there is no associated shortness of breath nausea coughing fever diaphoresis swelling of the legs or any other symptoms.  She does report that intermittently she will have a feeling of her whole body going tingling including her face or lips both arms and both legs, sometimes it is just the left side.  When she does have the chest pain it seems to last for between 5 and 10 seconds, very quick on a quick cough, not associated with exertion or activity.  It came on today while she was at work in a sitting position.  I have personally reviewed the medical record, on July 11, 2019 the patient was seen in the cardiology offices, they thought that the chest pain was atypical, there is no arrhythmias on an event monitor.  The patient does not smoke or drink alcohol.  Past Medical History:  Diagnosis Date  . Hypertension   . Migraines   . No pertinent past medical history     Patient Active Problem List   Diagnosis Date Noted  . Palpitations 05/26/2019  . Exertional dyspnea 12/29/2018  . Sinus tachycardia 12/02/2018  . Elevated blood pressure reading in office without diagnosis of hypertension 12/02/2018  . Atypical chest pain 12/02/2018  . 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 Severe preeclampsia 05/26/2011  . History of cesarean section 04/28/2011  . Transient hypertension of pregnancy, antepartum 04/01/2011  . Supervision of high-risk pregnancy 03/03/2011    Past Surgical History:    Procedure Laterality Date  . CESAREAN SECTION    . CESAREAN SECTION  05/25/2011   Procedure: CESAREAN SECTION;  Surgeon: Lesly Dukes, MD;  Location: [facility];  Service: Gynecology;  Laterality: N/A;  Repeat cesarean section with delivery of baby boy at 47. Apgars 6/9.     OB History    Gravida  2   Para  2   Term  2   Preterm  0   AB  0   Living  2     SAB  0   TAB  0   Ectopic  0   Multiple  0   Live Births  2           Family History  Problem Relation Age of Onset  . Hypertension Father   . Other Father        "Big heart"  . Diabetes Mother   . Hypertension Mother   . Asthma Sister   . Other Paternal Grandfather        "Big heart"    Social History   Tobacco Use  . Smoking status: Never Smoker  . Smokeless tobacco: Never Used  Substance Use Topics  . Alcohol use: No  . Drug use: No    Home Medications Prior to Admission medications   Medication Sig Start Date End Date Taking? Authorizing Provider  diltiazem (CARDIZEM CD) 240 MG 24 hr capsule Take 1 capsule (240 mg total) by mouth daily. 07/11/19 10/09/19  Patwardhan, Manish J, MD  Erenumab-aooe (AIMOVIG) 70 MG/ML SOAJ Inject 70 mg into the skin every 30 (thirty) days. 07/04/19   Pieter Partridge, DO  ferrous sulfate 324 MG TBEC Take 65 mg by mouth daily.    [provider]  naproxen sodium (ANAPROX) 550 MG tablet TAKE 1 TABLET BY MOUTH EVERY 12 HOURS AS NEEDED 06/29/19   Jaffe, Adam R, DO  UBRELVY 100 MG TABS TAKE 1 TABLET BY MOUTH ONCE DAILY AS NEEDED 08/10/19   Pieter Partridge, DO    Allergies    Metoprolol, Propranolol hcl, and Diltiazem  Review of Systems   Review of Systems  All other systems reviewed and are negative.   Physical Exam Updated Vital Signs BP (!) 149/84 (BP Location: Right Arm)   Pulse (!) 106   Temp 98.5 F (36.9 C) (Oral)   Resp 17   SpO2 100%   Physical Exam Vitals and nursing note reviewed.  Constitutional:      General: She is not in acute  distress.    Appearance: She is well-developed.  HENT:     Head: Normocephalic and atraumatic.     Mouth/Throat:     Pharynx: No oropharyngeal exudate.  Eyes:     General: No scleral icterus.       Right eye: No discharge.        Left eye: No discharge.     Conjunctiva/sclera: Conjunctivae normal.     Pupils: Pupils are equal, round, and reactive to light.  Neck:     Thyroid: No thyromegaly.     Vascular: No JVD.  Cardiovascular:     Rate and Rhythm: Normal rate and regular rhythm.     Heart sounds: Normal heart sounds. No murmur. No friction rub. No gallop.   Pulmonary:     Effort: Pulmonary effort is normal. No respiratory distress.     Breath sounds: Normal breath sounds. No wheezing or rales.  Chest:     Chest wall: No tenderness.  Abdominal:     General: Bowel sounds are normal. There is no distension.     Palpations: Abdomen is soft. There is no mass.     Tenderness: There is no abdominal tenderness.  Musculoskeletal:        General: No tenderness. Normal range of motion.     Cervical back: Normal range of motion and neck supple.  Lymphadenopathy:     Cervical: No cervical adenopathy.  Skin:    General: Skin is warm and dry.     Findings: No erythema or rash.  Neurological:     General: No focal deficit present.     Mental Status: She is alert.     Coordination: Coordination normal.     Comments: The patient has normal sensation to the bilateral upper extremities, lower extremities, normal strength in bilateral upper extremities with grips, normal coordination and speech, no facial droop  Psychiatric:        Behavior: Behavior normal.     ED Results / Procedures / Treatments   Labs (all labs ordered are listed, but only abnormal results are displayed) Labs Reviewed  BASIC METABOLIC PANEL - Abnormal; Notable for the following components:      Result Value   Glucose, Bld 133 (*)    All other components within normal limits  CBC  I-STAT BETA HCG BLOOD, ED ([facility], [facility] ONLY)  TROPONIN I (HIGH SENSITIVITY)    EKG EKG Interpretation  Date/Time:  Tuesday August 23 2019  13:13:47 EST Ventricular Rate:  99 PR Interval:  174 QRS Duration: 64 QT Interval:  302 QTC Calculation: 387 R Axis:   86 Text Interpretation: Normal sinus rhythm Nonspecific ST and T wave abnormality Abnormal ECG since last tracing no significant change Confirmed by Eber Hong ([id number]) on 08/23/2019 2:45:13 PM   Radiology DG Chest 2 View  Result Date: 08/23/2019 CLINICAL DATA:  Chest pain EXAM: CHEST - 2 VIEW COMPARISON:  July 24, 2019 FINDINGS: Lungs are clear. Heart size and pulmonary vascularity are normal. No adenopathy. No pneumothorax. No bone lesions. IMPRESSION: No abnormality noted. Electronically Signed   By: Bretta Bang III M.D.   On: 08/23/2019 13:48    Procedures Procedures (including critical care time)  Medications Ordered in ED Medications - No data to display  ED Course  I have reviewed the triage vital signs and the nursing notes.  Pertinent labs & imaging results that were available during my care of the patient were reviewed by me and considered in my medical decision making (see chart for details).    MDM Rules/Calculators/A&P                      This patient has a normal exam without murmurs rubs or gallops, no tachycardia and normal pulses.  She has no edema or jugular venous distention.  Her EKG is unremarkable and unchanged from prior EKGs and her story is not consistent with an acute coronary syndrome.  She has a negative troponin, negative CBC and metabolic panel  The chest x-ray which I have personally viewed and interpreted shows no signs of cardiomegaly, no pulmonary edema, no abnormal lungs, normal-appearing chest x-ray.  I feel that the patient symptoms are likely noncardiac, nonspecific and could even be related to anxiety given her diffuse paresthesias and tingling that occurs occasionally.  At this time she will be safely  discharged home, she can follow-up in the outpatient setting, there is no significant tachycardia, no swelling of the legs, no indication for CT angiogram at this time.  She is agreeable to follow-up with cardiology.  Final Clinical Impression(s) / ED Diagnoses Final diagnoses:  Chest pain, unspecified type    Rx / DC Orders ED Discharge Orders    None       Eber Hong, MD 08/23/19 1501

## 2019-08-30 ENCOUNTER — Telehealth: Payer: Self-pay | Admitting: Neurology

## 2019-08-30 NOTE — Telephone Encounter (Signed)
Could you add her to the waiting list if there is a cancellation. Thanks, I spoke with patient.

## 2019-08-30 NOTE — Telephone Encounter (Signed)
Yes I have her on the wait list

## 2019-08-30 NOTE — Telephone Encounter (Signed)
Patient called in regarding needing a sooner appointment which was recommended by Dr. Modesto Charon. Her headaches are worse. She also had a question regarding her injection for March. Please Call. Thank you

## 2019-09-08 ENCOUNTER — Other Ambulatory Visit: Payer: Self-pay | Admitting: Neurology

## 2019-09-19 NOTE — Progress Notes (Signed)
Virtual Visit via Video Note The purpose of this virtual visit is to provide medical care while limiting exposure to the novel coronavirus.    Consent was obtained for video visit:  Yes.   Answered questions that patient had about telehealth interaction:  Yes.   I discussed the limitations, risks, security and privacy concerns of performing an evaluation and management service by telemedicine. I also discussed with the patient that there may be a patient responsible charge related to this service. The patient expressed understanding and agreed to proceed.  Pt location: Home Physician Location: office Name of referring provider:  Ileana Ladd, MD I connected with [name] at patients initiation/request on 09/20/2019 at  8:30 AM EDT by video enabled telemedicine application and verified that I am speaking with the correct person using two identifiers. Pt MRN:  [id number] Pt DOB:  February 10, 1986 Video Participants:  [name]   History of Present Illness:  Ann Frey is a 34 year old right-handed woman with sinus tachycardia who follows up for migraines  UPDATE: She started having daily headaches in November.  In December, topiramate was discontinued and she was started on Aimovig.  At first, she noted improvement but headaches have increased by the end of January.  She may have weeks with daily headaches and other weeks occur every other day (maybe 20 days a month).  Bernita Raisin is helpful.  Despite stopping topiramate, she may still have episodes of paresthesias once a week, lasting about 1 to 2 hours.  She sometimes has associated dizziness with these spells but not headache.    She continues to have chest pain, for which she has been to the ED since last visit, again diagnosed as noncardiac and likely related to anxiety.  She continues to have episodic paresthesias/dysesthesias.    Current NSAIDS:Naproxen 550 mg Current analgesics:None Current triptans:none Current  ergotamine:None Current anti-emetic:None Current muscle relaxants:None Current anti-anxiolytic:None Current sleep aide:None Current Antihypertensive medications:Cardizem Current Antidepressant medications:None Current Anticonvulsant medications:none Current anti-CGRP:Aimovig 70mg ; Ubrelvy 100mg  Current Vitamins/Herbal/Supplements:None Current Antihistamines/Decongestants:None Other therapy:None  Caffeine:No coffee Alcohol:No Smoker:No Diet:Drinks plenty of water Exercise:Walks daily Depression:No; Anxiety:Yes Other pain:None times neck pain Sleep hygiene:Good  HISTORY: She began having headaches intermittentlysince 2013. Location: Temples/forehead bilateral. Quality: pounding. Initial intensity: 10/10. Aura: no. Prodrome: no. Associated symptoms: Spinning sensation, nausea, phonophobia. Whitening/fogginess of vision. No vomiting, diplopia, slurred speech, focal numbness or weakness. Initial Duration: 3 days. Initial Frequency: Twice a year but has daily dull headaches. Triggers:  Fatigue.  Also chemicals from cleaning fluids. Relieving factors: None. Activity: Goes to hospital when severe.  Past NSAIDS:no Past analgesics:Tylenol Past abortive triptans:sumatriptan (did not tolerate); rizatriptan (palpitations) Past abortive ergotamine:no Past muscle relaxants:no Past anti-emetic:no Past antihypertensive medications:Propranolol ER 80mg  (shortness of breath and rash), metoprolol (lightheadedness) Past antidepressant medications:Nortriptyline 10mg  (chest pain) Past anticonvulsant medications:topiramate 150mg  (irritability, memory deficits, dry mouth, paresthesias) Past anti-CGRP:no Past vitamins/Herbal/Supplements:no Past antihistamines/decongestants:no Other past therapies:no  She has history of other somatic symptoms, particularly chest pain, palpitations and shortness of breath, which has been worked  up by cardiology and testing negative.  07/28/17. She developed sudden tingling of the entire upper and lower face which then involved the entire body with burning and redness in the hands. She had associated generalized weakness, spinning sensation, nausea, shortness of breath and visual disturbance described as blurred vision with dimming of vision. She denied headache at the time. Head CT was personally reviewed and was unremarkable. EKG and labs were unremarkable. Since then, she continues to feel generalized  weakness, dysesthesias of the face and entire body/extremities and lightheadedness. MRI of brain with and without contrast was performed on 08/19/17, which was personally reviewed and demonstrated nonspecific mild scattered small subcortical white matter foci, likely related to migraine, and incidental developmental venous anomalyOtherwise, no acute abnormalities.  Topiramate was discontinued but spells persisted.    Past Medical History: Past Medical History:  Diagnosis Date  . Hypertension   . Migraines   . No pertinent past medical history     Medications: Outpatient Encounter Medications as of 09/20/2019  Medication Sig  . diltiazem (CARDIZEM CD) 240 MG 24 hr capsule Take 1 capsule (240 mg total) by mouth daily.  Dorise Hiss (AIMOVIG) 70 MG/ML SOAJ Inject 70 mg into the skin every 30 (thirty) days.  . ferrous sulfate 324 MG TBEC Take 65 mg by mouth daily.  . naproxen sodium (ANAPROX) 550 MG tablet TAKE 1 TABLET BY MOUTH EVERY 12 HOURS AS NEEDED  . UBRELVY 100 MG TABS TAKE 1 TABLET BY MOUTH ONCE DAILY AS NEEDED   No facility-administered encounter medications on file as of 09/20/2019.    Allergies: Allergies  Allergen Reactions  . Metoprolol   . Propranolol Hcl   . Diltiazem Hives    Family History: Family History  Problem Relation Age of Onset  . Hypertension Father   . Other Father        "Big heart"  . Diabetes Mother   . Hypertension Mother   . Asthma  Sister   . Other Paternal Grandfather        "Big heart"    Social History: Social History   Socioeconomic History  . Marital status: Married    Spouse name: Not on file  . Number of children: 2  . Years of education: certificate in early childhood education  . Highest education level: Some college, no degree  Occupational History  . Not on file  Tobacco Use  . Smoking status: Never Smoker  . Smokeless tobacco: Never Used  Substance and Sexual Activity  . Alcohol use: No  . Drug use: No  . Sexual activity: Not Currently  Other Topics Concern  . Not on file  Social History Narrative   H.S graduate.    Unemployed due to COVID   Lives in two story home with husband and 2 sons   Right handed   Does not drinks coffee, drinks tea sometimes. Also drinks soda sometimes    Social Determinants of Health   Financial Resource Strain:   . Difficulty of Paying Living Expenses:   Food Insecurity:   . Worried About Programme researcher, broadcasting/film/video in the Last Year:   . Barista in the Last Year:   Transportation Needs:   . Freight forwarder (Medical):   Marland Kitchen Lack of Transportation (Non-Medical):   Physical Activity:   . Days of Exercise per Week:   . Minutes of Exercise per Session:   Stress:   . Feeling of Stress :   Social Connections:   . Frequency of Communication with Friends and Family:   . Frequency of Social Gatherings with Friends and Family:   . Attends Religious Services:   . Active Member of Clubs or Organizations:   . Attends Banker Meetings:   Marland Kitchen Marital Status:   Intimate Partner Violence:   . Fear of Current or Ex-Partner:   . Emotionally Abused:   Marland Kitchen Physically Abused:   . Sexually Abused:     Observations/Objective:  Height 5\' 4"  (1.626 m), weight 206 lb (93.4 kg). No acute distress.  Alert and oriented.  Speech fluent and not dysarthric.  Language intact.  Eyes orthophoric on primary gaze.  Face symmetric.  Assessment and Plan:   1.   Chronic migraine without aura, without status migrainosus, not intractable 2.  Episodic paresthesias with dizziness.  May be migraine aura without headache but uncertain 3.  Atypical chest pain and palpitations, not related to migraine  1.  For preventative management, increase Aimovig to 140mg  every month 2.  For abortive therapy, Ubrelvy 100mg  3.  Limit use of pain relievers to no more than 2 days out of week to prevent risk of rebound or medication-overuse headache. 4.  Keep headache diary 5.  Exercise, hydration, caffeine cessation, sleep hygiene, monitor for and avoid triggers 6. Follow up 4 months   Follow Up Instructions:    -I discussed the assessment and treatment plan with the patient. The patient was provided an opportunity to ask questions and all were answered. The patient agreed with the plan and demonstrated an understanding of the instructions.   The patient was advised to call back or seek an in-person evaluation if the symptoms worsen or if the condition fails to improve as anticipated.    Dudley Major, DO

## 2019-09-20 ENCOUNTER — Telehealth (INDEPENDENT_AMBULATORY_CARE_PROVIDER_SITE_OTHER): Payer: BC Managed Care – PPO | Admitting: Neurology

## 2019-09-20 ENCOUNTER — Other Ambulatory Visit: Payer: Self-pay

## 2019-09-20 ENCOUNTER — Encounter: Payer: Self-pay | Admitting: Neurology

## 2019-09-20 VITALS — Ht 64.0 in | Wt 206.0 lb

## 2019-09-20 DIAGNOSIS — G43109 Migraine with aura, not intractable, without status migrainosus: Secondary | ICD-10-CM

## 2019-09-20 DIAGNOSIS — G43009 Migraine without aura, not intractable, without status migrainosus: Secondary | ICD-10-CM | POA: Diagnosis not present

## 2019-09-20 MED ORDER — UBRELVY 100 MG PO TABS: 1.0000 | ORAL_TABLET | Freq: Every day | ORAL | 11 refills | Status: DC | PRN

## 2019-09-20 MED ORDER — AIMOVIG 70 MG/ML ~~LOC~~ SOAJ: 140.0000 mg | SUBCUTANEOUS | 11 refills | Status: DC

## 2019-10-04 ENCOUNTER — Telehealth: Payer: Self-pay | Admitting: Neurology

## 2019-10-04 MED ORDER — AIMOVIG 140 MG/ML ~~LOC~~ SOAJ: 140.0000 mg | SUBCUTANEOUS | 3 refills | Status: DC

## 2019-10-04 NOTE — Telephone Encounter (Signed)
Patient's pharmacy called and is requesting a new prescription for Aimovig for 140 ML pen. They said it was sent in for 120 ML but needs to be 140 ML instead.

## 2019-10-04 NOTE — Telephone Encounter (Signed)
New Aimovig script sent to pharmacy.

## 2019-10-05 ENCOUNTER — Encounter: Payer: Self-pay | Admitting: Cardiology

## 2019-10-10 ENCOUNTER — Encounter: Payer: Self-pay | Admitting: Cardiology

## 2019-10-10 ENCOUNTER — Other Ambulatory Visit: Payer: Self-pay

## 2019-10-10 ENCOUNTER — Ambulatory Visit: Payer: BC Managed Care – PPO | Admitting: Cardiology

## 2019-10-10 VITALS — BP 144/82 | HR 83 | Temp 98.3°F | Resp 17 | Ht 64.0 in | Wt 209.0 lb

## 2019-10-10 DIAGNOSIS — R0789 Other chest pain: Secondary | ICD-10-CM

## 2019-10-10 DIAGNOSIS — R000 Tachycardia, unspecified: Secondary | ICD-10-CM

## 2019-10-10 DIAGNOSIS — R002 Palpitations: Secondary | ICD-10-CM

## 2019-10-10 MED ORDER — DILTIAZEM HCL ER COATED BEADS 240 MG PO CP24: 240.0000 mg | ORAL_CAPSULE | Freq: Every day | ORAL | 1 refills | Status: DC

## 2019-10-10 NOTE — Progress Notes (Signed)
Subjective:   Ann Frey, female    DOB: 25-Feb-1986, 34 y.o.   MRN: [id number]   Chief complaint:  Tachycardia   HPI  34 y.o. African American female with palpitations.  She is doing well. Barring two visits to ED-where workup was essentially negative, she has not had any other chest pain episdoes. She is walking up to 30 min without any symptoms. Blood pressure elevated today, usually lower at home.   Current Outpatient Medications on File Prior to Visit  Medication Sig Dispense Refill  . Erenumab-aooe (AIMOVIG) 140 MG/ML SOAJ Inject 140 mg into the skin every 30 (thirty) days. 1.12 mg 3  . ferrous sulfate 324 MG TBEC Take 65 mg by mouth daily.    . naproxen sodium (ANAPROX) 550 MG tablet TAKE 1 TABLET BY MOUTH EVERY 12 HOURS AS NEEDED 16 tablet 0  . Ubrogepant (UBRELVY) 100 MG TABS Take 1 tablet by mouth daily as needed (May repeat  in 2 hours if needed. Maximum 2 tablets in 24 hours.). 10 tablet 11  . vitamin B-12 (CYANOCOBALAMIN) 500 MCG tablet Take 500 mcg by mouth daily.    Marland Kitchen diltiazem (CARDIZEM CD) 240 MG 24 hr capsule Take 1 capsule (240 mg total) by mouth daily. 90 capsule 3   No current facility-administered medications on file prior to visit.    Cardiovascular studies:  EKG 07/11/2019: Sinus rhythm 100 bpm. Left atrial enlargement.  Nonspecific T-abnormality.   Event monitor 05/27/2019 - 06/25/2019: Diagnostic time: 97%  Dominant rhythm: Sinus. HR 54-145 bpm. Avg HR 80 bpm. No atrial fibrillation/atrial flutter/SVT/VT/high grade AV block, sinus pause >3sec noted. Symptoms reported: None  EKG treadmill stress test 10/06/2017: 8 METS. Dyspnea and fatigue. EKG negative for ischemia  Recent labs: 10/22/2018: H/H 12.7/38.5. MCV 80.  ESR 42 (chronically elevated). CRP  Normal. TSH 4.3 normal  09/21/2018: Glucose 87. BUN/Cr 10/0.91. eGFR 85. Na/K 139/4.1 Rest of the CMP normal.   Echocardiogram 10/05/2017: EF 55%. Mild mitral  regurgitation.   Review of Systems  Cardiovascular: Positive for palpitations. Negative for chest pain, dyspnea on exertion, leg swelling and syncope.         Vitals:   10/10/19 1024  BP: (!) 144/82  Pulse: 83  Resp: 17  Temp: 98.3 F (36.8 C)  SpO2: 98%     Body mass index is 35.87 kg/m. Filed Weights   10/10/19 1024  Weight: 209 lb (94.8 kg)     Objective:    Physical Exam  Constitutional: She appears well-developed and well-nourished.  Neck: No JVD present.  Cardiovascular: Normal rate, regular rhythm, normal heart sounds and intact distal pulses.  No murmur heard. Pulmonary/Chest: Effort normal and breath sounds normal. She has no wheezes. She has no rales.  Musculoskeletal:        General: No edema.  Nursing note and vitals reviewed.         Assessment & Recommendations:    34 y.o. African American female with palpitations.  Palpitations, tachycardia: No arrhythmia on event monitor. Tolerating diltiazem 240 mg well, which should also help control her blood pressure.  No further cardiac workup necessary at this time. I will see her on as needed basis.   Nigel Mormon, MD [facility] Pager: [phone] Office: [phone]

## 2019-10-11 ENCOUNTER — Other Ambulatory Visit: Payer: Self-pay | Admitting: Neurology

## 2019-10-11 MED ORDER — AIMOVIG 140 MG/ML ~~LOC~~ SOAJ: 140.0000 mg | SUBCUTANEOUS | 11 refills | Status: DC

## 2019-10-26 ENCOUNTER — Telehealth: Payer: BC Managed Care – PPO | Admitting: Neurology

## 2019-12-25 ENCOUNTER — Encounter (HOSPITAL_COMMUNITY): Payer: Self-pay | Admitting: Emergency Medicine

## 2019-12-25 ENCOUNTER — Emergency Department (HOSPITAL_COMMUNITY)
Admission: EM | Admit: 2019-12-25 | Discharge: 2019-12-25 | Disposition: A | Discharge status: Home / Self Care | Payer: BC Managed Care – PPO | Attending: Emergency Medicine | Admitting: Emergency Medicine

## 2019-12-25 ENCOUNTER — Other Ambulatory Visit: Payer: Self-pay

## 2019-12-25 ENCOUNTER — Emergency Department (HOSPITAL_COMMUNITY): Payer: BC Managed Care – PPO

## 2019-12-25 DIAGNOSIS — R142 Eructation: Secondary | ICD-10-CM | POA: Diagnosis not present

## 2019-12-25 DIAGNOSIS — R519 Headache, unspecified: Secondary | ICD-10-CM | POA: Diagnosis not present

## 2019-12-25 DIAGNOSIS — R0602 Shortness of breath: Secondary | ICD-10-CM | POA: Insufficient documentation

## 2019-12-25 DIAGNOSIS — Z79899 Other long term (current) drug therapy: Secondary | ICD-10-CM | POA: Diagnosis not present

## 2019-12-25 DIAGNOSIS — R0789 Other chest pain: Secondary | ICD-10-CM | POA: Diagnosis not present

## 2019-12-25 DIAGNOSIS — R42 Dizziness and giddiness: Secondary | ICD-10-CM | POA: Diagnosis not present

## 2019-12-25 DIAGNOSIS — R002 Palpitations: Secondary | ICD-10-CM | POA: Insufficient documentation

## 2019-12-25 DIAGNOSIS — R682 Dry mouth, unspecified: Secondary | ICD-10-CM | POA: Insufficient documentation

## 2019-12-25 DIAGNOSIS — R140 Abdominal distension (gaseous): Secondary | ICD-10-CM | POA: Insufficient documentation

## 2019-12-25 DIAGNOSIS — I10 Essential (primary) hypertension: Secondary | ICD-10-CM | POA: Diagnosis not present

## 2019-12-25 DIAGNOSIS — H538 Other visual disturbances: Secondary | ICD-10-CM | POA: Diagnosis not present

## 2019-12-25 LAB — BASIC METABOLIC PANEL
Anion gap: 11 (ref 5–15)
BUN: 9 mg/dL (ref 6–20)
CO2: 24 mmol/L (ref 22–32)
Calcium: 9.8 mg/dL (ref 8.9–10.3)
Chloride: 104 mmol/L (ref 98–111)
Creatinine, Ser: 0.87 mg/dL (ref 0.44–1.00)
GFR calc Af Amer: >60 mL/min (ref >60)
GFR calc non Af Amer: >60 mL/min (ref >60)
Glucose, Bld: 88 mg/dL (ref 70–99)
Potassium: 4.3 mmol/L (ref 3.5–5.1)
Sodium: 139 mmol/L (ref 135–145)

## 2019-12-25 LAB — CBC
HCT: 41.8 % (ref 36.0–46.0)
Hemoglobin: 13.4 g/dL (ref 12.0–15.0)
MCH: 25.9 pg — ABNORMAL LOW (ref 26.0–34.0)
MCHC: 32.1 g/dL (ref 30.0–36.0)
MCV: 80.9 fL (ref 80.0–100.0)
Platelets: 292 10*3/uL (ref 150–400)
RBC: 5.17 MIL/uL — ABNORMAL HIGH (ref 3.87–5.11)
RDW: 12.9 % (ref 11.5–15.5)
WBC: 5.7 10*3/uL (ref 4.0–10.5)
nRBC: 0 % (ref 0.0–0.2)

## 2019-12-25 LAB — I-STAT BETA HCG BLOOD, ED (MC, WL, AP ONLY): I-stat hCG, quantitative: <5 m[IU]/mL (ref <5)

## 2019-12-25 LAB — CBG MONITORING, ED: Glucose-Capillary: 58 mg/dL — ABNORMAL LOW (ref 70–99)

## 2019-12-25 LAB — TSH: TSH: 2.618 u[IU]/mL (ref 0.350–4.500)

## 2019-12-25 LAB — T4, FREE: Free T4: 0.92 ng/dL (ref 0.61–1.12)

## 2019-12-25 LAB — TROPONIN I (HIGH SENSITIVITY)
Troponin I (High Sensitivity): <2 ng/L (ref <18)
Troponin I (High Sensitivity): 2 ng/L (ref <18)

## 2019-12-25 MED ORDER — SODIUM CHLORIDE 0.9% FLUSH: 3.0000 mL | Freq: Once | INTRAVENOUS | Status: DC

## 2019-12-25 NOTE — ED Provider Notes (Signed)
[facility] EMERGENCY DEPARTMENT Provider Note   CSN: [id number] Arrival date & time: 12/25/19  1012     History Chief Complaint  Patient presents with  . Chest Pain    Ann Frey is a 34 y.o. female with history of hypertension, palpitations, migraines presents to the ER from urgent care for evaluation of chest pain.  This began yesterday while she was changing to go out at around 10 AM.  It is left-sided described as a sharp, burning sensation sometimes a squeezing like go feeling.  Yesterday lasted for couple of hours.  She had associated heart racing, pain in between her shoulder blades and "crawling" sensation in both of her shoulders and shortness of breath.  Patient states she has "vestibular headaches" that typically give her left-sided sharp pains, throat dryness, blurred vision, lightheadedness and shortness of breath.  She was not sure if yesterday the shortness of breath was from this type of headache.  The chest pain has been intermittent since, when it comes on it only lasts a minute or so.  The chest pain seems to be worse if she sitting up or moving around better if she sits or lays down on her chair.  States she has had similar chest pain in the past but yesterday episode of chest pain was more painful than usual.  Was seen in the ER recently for chest pain.  She followed up with Dr. Priscella Mann with cardiology who put her on a Holter monitor and told her everything was normal.  She takes diltiazem for "blood pressure".  States she usually walks 30 minutes and occasionally gets some chest pain with it.  Has been starting to do Zumba but denies having chest pain during this although sometimes doing Zumba makes her head feel like it is on fire, pounding.  No missed doses of diltiazem.  She went to urgent care and was told to come to the ER because she reported having thyroid issues in the past and being on levothyroxine.  States on Friday she had a lot of bloating and  she took over-the-counter acid reflux medicine, she burped a lot and felt like it improved.  Denies any fever, cough, leg swelling or calf pain.  No history of blood clots, hormone therapy.  Stopped birth control 1 year ago.  No recent surgery, prolonged immobilization, hemoptysis.  No recent chest wall trauma or heavy lifting or musculoskeletal activity.  No tobacco.  No IV drug use.  No family history of CAD below 55. HPI     Past Medical History:  Diagnosis Date  . Hypertension   . Migraines   . No pertinent past medical history     Patient Active Problem List   Diagnosis Date Noted  . Palpitations 05/26/2019  . Exertional dyspnea 12/29/2018  . Sinus tachycardia 12/02/2018  . Elevated blood pressure reading in office without diagnosis of hypertension 12/02/2018  . Atypical chest pain 12/02/2018  . 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 Severe preeclampsia 05/26/2011  . History of cesarean section 04/28/2011  . Transient hypertension of pregnancy, antepartum 04/01/2011  . Supervision of high-risk pregnancy 03/03/2011    Past Surgical History:  Procedure Laterality Date  . CESAREAN SECTION    . CESAREAN SECTION  05/25/2011   Procedure: CESAREAN SECTION;  Surgeon: Lesly Dukes, MD;  Location: [facility];  Service: Gynecology;  Laterality: N/A;  Repeat cesarean section with delivery of baby boy at 57. Apgars 6/9.     OB History  Gravida  2   Para  2   Term  2   Preterm  0   AB  0   Living  2     SAB  0   TAB  0   Ectopic  0   Multiple  0   Live Births  2           Family History  Problem Relation Age of Onset  . Hypertension Father   . Other Father        "Big heart"  . Diabetes Mother   . Hypertension Mother   . Asthma Sister   . Other Paternal Grandfather        "Big heart"    Social History   Tobacco Use  . Smoking status: Never Smoker  . Smokeless tobacco: Never Used  Vaping Use  . Vaping Use: Never used  Substance Use Topics  .  Alcohol use: No  . Drug use: No    Home Medications Prior to Admission medications   Medication Sig Start Date End Date Taking? Authorizing Provider  diltiazem (CARDIZEM CD) 240 MG 24 hr capsule Take 1 capsule (240 mg total) by mouth daily. 10/10/19 01/08/20  Patwardhan, Anabel Bene, MD  Erenumab-aooe (AIMOVIG) 140 MG/ML SOAJ Inject 140 mg into the skin every 30 (thirty) days. 10/11/19   Drema Dallas, DO  ferrous sulfate 324 MG TBEC Take 65 mg by mouth daily.    [provider]  naproxen sodium (ANAPROX) 550 MG tablet TAKE 1 TABLET BY MOUTH EVERY 12 HOURS AS NEEDED 09/08/19   Everlena Cooper, Adam R, DO  Ubrogepant (UBRELVY) 100 MG TABS Take 1 tablet by mouth daily as needed (May repeat  in 2 hours if needed. Maximum 2 tablets in 24 hours.). 09/20/19   Drema Dallas, DO  vitamin B-12 (CYANOCOBALAMIN) 500 MCG tablet Take 500 mcg by mouth daily.    [provider]    Allergies    Metoprolol, Propranolol hcl, and Diltiazem  Review of Systems   Review of Systems  Respiratory: Positive for shortness of breath.   Cardiovascular: Positive for chest pain and palpitations.  Neurological: Positive for headaches.  All other systems reviewed and are negative.   Physical Exam Updated Vital Signs BP (!) 145/91   Pulse 87   Temp 98.7 F (37.1 C) (Oral)   Resp 16   Ht 5\' 3"  (1.6 m)   Wt 92.1 kg   LMP 12/13/2019   SpO2 100%   BMI 35.96 kg/m   Physical Exam Constitutional:      Appearance: She is well-developed.     Comments: NAD. Non toxic.   HENT:     Head: Normocephalic and atraumatic.     Nose: Nose normal.  Eyes:     General: Lids are normal.     Conjunctiva/sclera: Conjunctivae normal.  Neck:     Trachea: Trachea normal.     Comments: Trachea midline.  Cardiovascular:     Rate and Rhythm: Normal rate and regular rhythm.     Pulses:          Radial pulses are 1+ on the right side and 1+ on the left side.       Dorsalis pedis pulses are 1+ on the right side and 1+ on the left  side.     Heart sounds: Normal heart sounds, S1 normal and S2 normal.     Comments: No murmurs. No LE edema or calf tenderness.  Pulmonary:  Effort: Pulmonary effort is normal.     Breath sounds: Normal breath sounds.  Chest:     Comments: No chest wall tenderness Abdominal:     General: Bowel sounds are normal.     Palpations: Abdomen is soft.     Tenderness: There is no abdominal tenderness.     Comments: No epigastric or upper abdominal tenderness.  Musculoskeletal:     Cervical back: Normal range of motion.  Skin:    General: Skin is warm and dry.     Capillary Refill: Capillary refill takes less than 2 seconds.     Comments: No rash to chest wall  Neurological:     Mental Status: She is alert.     GCS: GCS eye subscore is 4. GCS verbal subscore is 5. GCS motor subscore is 6.     Comments: Sensation and strength intact in upper/lower extremities  Psychiatric:        Speech: Speech normal.        Behavior: Behavior normal.        Thought Content: Thought content normal.     ED Results / Procedures / Treatments   Labs (all labs ordered are listed, but only abnormal results are displayed) Labs Reviewed  CBC - Abnormal; Notable for the following components:      Result Value   RBC 5.17 (*)    MCH 25.9 (*)    All other components within normal limits  CBG MONITORING, ED - Abnormal; Notable for the following components:   Glucose-Capillary 58 (*)    All other components within normal limits  BASIC METABOLIC PANEL  TSH  T4, FREE  I-STAT BETA HCG BLOOD, ED ([facility], [facility], [facility] ONLY)  TROPONIN I (HIGH SENSITIVITY)  TROPONIN I (HIGH SENSITIVITY)    EKG None  Radiology DG Chest 2 View  Result Date: 12/25/2019 CLINICAL DATA:  Chest pain EXAM: CHEST - 2 VIEW COMPARISON:  08/23/2019 FINDINGS: The heart size and mediastinal contours are within normal limits. Both lungs are clear. The visualized skeletal structures are unremarkable. IMPRESSION: No active cardiopulmonary disease.  Electronically Signed   By: Duanne Guess D.O.   On: 12/25/2019 12:14    Procedures Procedures (including critical care time)  Medications Ordered in ED Medications  sodium chloride flush (NS) 0.9 % injection 3 mL (has no administration in time range)    ED Course  I have reviewed the triage vital signs and the nursing notes.  Pertinent labs & imaging results that were available during my care of the patient were reviewed by me and considered in my medical decision making (see chart for details).  Clinical Course as of Dec 25 1518  Sun Dec 25, 2019  1251 110-140s/80-100s   [CG]    Clinical Course User Index [CG] Jerrell Mylar   MDM Rules/Calculators/A&P                          This patient presents for chest pain.  Medical records available, nursing notes reviewed to obtain more history and assist with MDM.  In summary, patient has been seen several times for atypical chest pain. Seen by cardiologist recently deemed chest pain atypical. Had Holter monitor for palpitations that did not show any arrhythmias. Previous cardiac work-ups in the ER unremarkable.  DDX is atypical chest pain, MSK etiology possibly GI related given the 2 days ago she had bloating, increased gas that improves after gas medicine.  ER work-up initiated  by triage nurse.  I reviewed and personally visualized and interpreted the above labs and imaging studies.  EKG without ischemia, arrhythmias, right ventricular strain signs. Chest x-ray is nonacute. Troponins x2 undetectable. She is PERC negative.   Patient's clinical presentation and work-up. Is reassuring. Her heart score is less than 3, other than hypertension she does not have any other cardiac risk factors. PERC negative. No neuro pulse deficits. No infectious symptoms. No pleuritic component. Overall doubt ACS, PE, dissection, AAA, pneumonia, endocarditis.  Patient requesting thyroid labs done because urgent care explained to her  that given previous history of thyroid issues that this could be related to her palpitations and chest pain. Patient states she stopped taking levothyroxine because her doctor told her she did not need it anymore. Clinical presentation is not consistent with thyroid emergency. TSH/T4 normal.  Discussed benign work-up with patient, encouraged her to follow-up closely with cardiology. Return precautions discussed. She is comfortable this.  Final Clinical Impression(s) / ED Diagnoses Final diagnoses:  Atypical chest pain    Rx / DC Orders ED Discharge Orders    None       Arlean Hopping 12/25/19 1520    Lacretia Leigh, MD 12/25/19 1954

## 2019-12-25 NOTE — ED Triage Notes (Addendum)
Reports intermittent L sided chest pain and palpitations since yesterday.  Reports mild SOB.  Denies nausea and vomiting.  Denies pain at present.   Seen at [facility] this morning and given ASA.

## 2019-12-25 NOTE — Discharge Instructions (Addendum)
You were evaluated in the emergency department for chest pain.    Based on your risk factors, work up and exam you are considered low risk for major adverse cardiac events in the next 30 days.  This means you can be discharged with close follow up with follow up with primary care doctor or cardiology for further outpatient work up as needed, if your chest discomfort continues  The cause of your chest pain is unclear but her work-up does not reveal any heart injury, lung issues, blood clot or thyroid abnormalities  Call your primary care doctor or cardiologist as soon as possible for further discussion and work up of your symptoms on an outpatient setting  Please return to ED if: Your chest pain is worse or on exertion You have a cough that gets worse, or you cough up blood. You have severe pain in chest, back or abdomen. You have chest pain with loss of sensation, weakness or tingling in extremities You have chest pain or shortness of breath with exertion or activity You have sudden, unexplained discomfort in your chest, with radiation arms, back, neck, or jaw. You suddenly have chest pain and begin to sweat, or your skin gets clammy. You feel chest pain with nausea or vomiting, blood in vomit You suddenly feel light-headed or faint. Your heart begins to beat quickly, or it feels like it is skipping beats. You have one sided leg swelling or calf pain You have chest pain with fever, chills, cough or other viral symptoms  If you feel like your chest discomfort is associated with food or worse with foods, bloating, burping, gas you can try over-the-counter omeprazole 40 mg every morning on an empty stomach.  You can also use as needed famotidine 20 mg during or after meals for more symptom control.

## 2019-12-29 ENCOUNTER — Other Ambulatory Visit: Payer: Self-pay

## 2019-12-29 ENCOUNTER — Ambulatory Visit: Payer: BC Managed Care – PPO | Admitting: Cardiology

## 2019-12-29 ENCOUNTER — Encounter: Payer: Self-pay | Admitting: Cardiology

## 2019-12-29 VITALS — BP 128/82 | HR 100 | Resp 17 | Ht 63.0 in | Wt 198.0 lb

## 2019-12-29 DIAGNOSIS — I10 Essential (primary) hypertension: Secondary | ICD-10-CM

## 2019-12-29 DIAGNOSIS — R0789 Other chest pain: Secondary | ICD-10-CM

## 2019-12-29 DIAGNOSIS — K219 Gastro-esophageal reflux disease without esophagitis: Secondary | ICD-10-CM

## 2019-12-29 MED ORDER — ESOMEPRAZOLE MAGNESIUM 20 MG PO CPDR: 20.0000 mg | DELAYED_RELEASE_CAPSULE | Freq: Every day | ORAL | 2 refills | Status: DC

## 2019-12-29 MED ORDER — METOPROLOL SUCCINATE ER 50 MG PO TB24: 50.0000 mg | ORAL_TABLET | Freq: Every day | ORAL | 3 refills | Status: DC

## 2019-12-29 NOTE — Progress Notes (Signed)
Subjective:   Ann Frey, female    DOB: 12-13-1985, 34 y.o.   MRN: [id number]   Chief Complaint  Patient presents with  . Chest Pain   34 y.o. African American female with palpitations, chest pain.  Patient reports retrosternal burning sensation radiating to her left arm and back.  This has been constant for the last few days, with no relation to exertion.  Patient was seen in the emergency department on 12/25/2019, with unremarkable cardiac work-up.  Patient is concerned about her tachycardia, and feels that diltiazem is causing her symptoms.  She would like to switch this to another medication.  Current Outpatient Medications on File Prior to Visit  Medication Sig Dispense Refill  . diltiazem (CARDIZEM CD) 240 MG 24 hr capsule Take 1 capsule (240 mg total) by mouth daily. 90 capsule 1  . Erenumab-aooe (AIMOVIG) 140 MG/ML SOAJ Inject 140 mg into the skin every 30 (thirty) days. 1 pen 11  . ferrous sulfate 324 MG TBEC Take 65 mg by mouth daily.    . naproxen sodium (ANAPROX) 550 MG tablet TAKE 1 TABLET BY MOUTH EVERY 12 HOURS AS NEEDED 16 tablet 0  . omeprazole (PRILOSEC) 20 MG capsule Take 20 mg by mouth daily.    Marland Kitchen Ubrogepant (UBRELVY) 100 MG TABS Take 1 tablet by mouth daily as needed (May repeat  in 2 hours if needed. Maximum 2 tablets in 24 hours.). 10 tablet 11  . vitamin B-12 (CYANOCOBALAMIN) 500 MCG tablet Take 500 mcg by mouth daily.     No current facility-administered medications on file prior to visit.    Cardiovascular studies:  EKG 12/29/2019: Sinus rhythm 91 bpm  Low voltage in precordial leads.  Left atrial enlargement.  Nonspecific T-abnormality.   Event monitor 05/27/2019 - 06/25/2019: Diagnostic time: 97%  Dominant rhythm: Sinus. HR 54-145 bpm. Avg HR 80 bpm. No atrial fibrillation/atrial flutter/SVT/VT/high grade AV block, sinus pause >3sec noted. Symptoms reported: None  EKG treadmill stress test 10/06/2017: 8 METS. Dyspnea and fatigue. EKG  negative for ischemia  Echocardiogram 10/05/2017: EF 55%. Mild mitral regurgitation.  Recent labs: 12/25/2019: Glucose 88, BUN/Cr 8/0.87. EGFR >60. Na/K 139/4.3. Rest of the CMP normal H/H 13/41. MCV 80. Platelets 292 TSH 2.6 normal   Review of Systems  Cardiovascular: Positive for chest pain and palpitations. Negative for dyspnea on exertion, leg swelling and syncope.         Vitals:   12/29/19 1026 12/29/19 1027  BP: (!) 151/84 128/82  Pulse: 94 100  Resp: 17   SpO2: 96% 96%     Body mass index is 35.07 kg/m. Filed Weights   12/29/19 1026  Weight: 198 lb (89.8 kg)     Objective:    Physical Exam Vitals and nursing note reviewed.  Constitutional:      Appearance: She is well-developed.  Neck:     Vascular: No JVD.  Cardiovascular:     Rate and Rhythm: Normal rate and regular rhythm.     Pulses: Intact distal pulses.     Heart sounds: Normal heart sounds. No murmur heard.   Pulmonary:     Effort: Pulmonary effort is normal.     Breath sounds: Normal breath sounds. No wheezing or rales.           Assessment & Recommendations:    34 y.o. African American female with palpitations, chest pain.  Chest pain, palpitations: Previously unremarkable cardiac work-up including stress test and echocardiogram in 2019.  Symptoms are atypical for  angina, most likely reflecting GERD.  Have asked her to try Nexium instead of omeprazole.  Also, at her request, I have switched her diltiazem to another medication-metoprolol succinate 50 mg daily.  She has had intolerance to propranolol before, but should be able to tolerate metoprolol.    I will see her back in 4 weeks.  If symptoms do not resolve, could consider CTA for definitive further evaluation.  Nigel Mormon, MD [facility] Pager: [phone] Office: [phone]

## 2020-01-04 ENCOUNTER — Ambulatory Visit: Payer: BC Managed Care – PPO | Admitting: Neurology

## 2020-01-04 ENCOUNTER — Encounter: Payer: Self-pay | Admitting: Neurology

## 2020-01-04 ENCOUNTER — Other Ambulatory Visit: Payer: Self-pay

## 2020-01-04 VITALS — BP 122/78 | HR 76 | Ht 64.0 in | Wt 204.5 lb

## 2020-01-04 DIAGNOSIS — G43109 Migraine with aura, not intractable, without status migrainosus: Secondary | ICD-10-CM

## 2020-01-04 DIAGNOSIS — G43009 Migraine without aura, not intractable, without status migrainosus: Secondary | ICD-10-CM | POA: Diagnosis not present

## 2020-01-04 NOTE — Patient Instructions (Signed)
°  1. Continue Aimovig 140mg .  Take every 28 days..   2. Take at earliest onset of headache.  May repeat dose once in 2 hours if needed.  Maximum 2 tablets in 24 hours. 3. Limit use of pain relievers to no more than 2 days out of the week.  These medications include acetaminophen, NSAIDs (ibuprofen/Advil/Motrin, naproxen/Aleve, triptans (Imitrex/sumatriptan), Excedrin, and narcotics.  This will help reduce risk of rebound headaches. 4. Be aware of common food triggers:  - Caffeine:  coffee, black tea, cola, Mt. Dew  - Chocolate  - Dairy:  aged cheeses (brie, blue, cheddar, gouda, Robards, provolone, [place], Swiss, etc), chocolate milk, buttermilk, sour cream, limit eggs and yogurt  - Nuts, peanut butter  - Alcohol  - Cereals/grains:  FRESH breads (fresh bagels, sourdough, doughnuts), yeast productions  - Processed/canned/aged/cured meats (pre-packaged deli meats, hotdogs)  - MSG/glutamate:  soy sauce, flavor enhancer, pickled/preserved/marinated foods  - Sweeteners:  aspartame (Equal, Nutrasweet).  Sugar and Splenda are okay  - Vegetables:  legumes (lima beans, lentils, snow peas, fava beans, pinto peans, peas, garbanzo beans), sauerkraut, onions, olives, pickles  - Fruit:  avocados, bananas, citrus fruit (orange, lemon, grapefruit), mango  - Other:  Frozen meals, macaroni and cheese 5. Routine exercise 6. Stay adequately hydrated (aim for 64 oz water daily) 7. Keep headache diary 8. Maintain proper stress management 9. Maintain proper sleep hygiene 10. Do not skip meals 11. Consider supplements:  magnesium citrate 400mg  daily, riboflavin 400mg  daily, coenzyme Q10 100mg  three times daily.

## 2020-01-04 NOTE — Progress Notes (Signed)
NEUROLOGY FOLLOW UP OFFICE NOTE  Ann Frey [id number]  HISTORY OF PRESENT ILLNESS: Ann Frey is a 34 year old right-handed woman with sinus tachycardia who follows up for migraines.  UPDATE: Aimovig increased in March.  She noted improvement.  She has been averaging 2 to 3 a month.  They may occur more frequently the last week prior to next injection.  She was seen in the ED last week for atypical chest pain.  She was started on esomeprazole and pain has stopped.    Current NSAIDS:Naproxen 550 mg Current analgesics:None Current triptans:none Current ergotamine:None Current anti-emetic:None Current muscle relaxants:None Current anti-anxiolytic:None Current sleep aide:None Current Antihypertensive medications:Cardizem Current Antidepressant medications:None Current Anticonvulsant medications:none Current anti-CGRP:Aimovig 140mg ; Ubrelvy 100mg  Current Vitamins/Herbal/Supplements:None Current Antihistamines/Decongestants:None Other therapy:None  Caffeine:No coffee Alcohol:No Smoker:No Diet:Drinks plenty of water Exercise:Walks daily Depression:No; Anxiety:Yes Other pain:None times neck pain Sleep hygiene:Good  HISTORY: She began having headaches intermittentlysince 2013. Location: Temples/forehead bilateral. Quality: pounding. Initial intensity: 10/10. Aura: no. Prodrome: no. Associated symptoms:Spinning sensation, nausea, phonophobia. Whitening/fogginess of vision. No vomiting, diplopia, slurred speech, focal numbness or weakness. Initial Duration: 3 days. Initial Frequency: Twice a year but has daily dull headaches. Triggers: Fatigue.  Also chemicals from cleaning fluids. Relieving factors: None. Activity: Goes to hospital when severe.  Past NSAIDS:no Past analgesics:Tylenol Past abortive triptans:sumatriptan (did not tolerate); rizatriptan (palpitations) Past abortive ergotamine:no Past muscle  relaxants:no Past anti-emetic:no Past antihypertensive medications:Propranolol ER 80mg  (shortness of breath and rash), metoprolol (lightheadedness) Past antidepressant medications:Nortriptyline 10mg  (chest pain) Past anticonvulsant medications:topiramate 150mg  (irritability, memory deficits, dry mouth, paresthesias) Past anti-CGRP:no Past vitamins/Herbal/Supplements:no Past antihistamines/decongestants:no Other past therapies:no  She has history of other somatic symptoms, particularly chest pain, palpitations and shortness of breath, which has been worked up by cardiology and testing negative.  07/28/17. She developed sudden tingling of the entire upper and lower face which then involved the entire body with burning and redness in the hands. She had associated generalized weakness, spinning sensation, nausea, shortness of breath and visual disturbance described as blurred vision with dimming of vision. She denied headache at the time. Head CT was personally reviewed and was unremarkable. EKG and labs were unremarkable. Since then, she continues to feel generalized weakness, dysesthesias of the face and entire body/extremities and lightheadedness. MRI of brain with and without contrast was performed on 08/19/17, which was personally reviewed and demonstrated nonspecific mild scattered small subcortical white matter foci, likely related to migraine, and incidental developmental venous anomalyOtherwise, no acute abnormalities.  Topiramate was discontinued but spells persisted.    PAST MEDICAL HISTORY: Past Medical History:  Diagnosis Date  . Hypertension   . Migraines   . No pertinent past medical history     MEDICATIONS: Current Outpatient Medications on File Prior to Visit  Medication Sig Dispense Refill  . Erenumab-aooe (AIMOVIG) 140 MG/ML SOAJ Inject 140 mg into the skin every 30 (thirty) days. 1 pen 11  . esomeprazole (NEXIUM) 20 MG capsule Take 1 capsule (20 mg  total) by mouth daily at 12 noon. 30 capsule 2  . ferrous sulfate 324 MG TBEC Take 65 mg by mouth daily.    . metoprolol succinate (TOPROL-XL) 50 MG 24 hr tablet Take 1 tablet (50 mg total) by mouth daily. Take with or immediately following a meal. 90 tablet 3  . naproxen sodium (ANAPROX) 550 MG tablet TAKE 1 TABLET BY MOUTH EVERY 12 HOURS AS NEEDED 16 tablet 0  . Ubrogepant (UBRELVY) 100 MG TABS Take 1 tablet by mouth daily as needed (  May repeat  in 2 hours if needed. Maximum 2 tablets in 24 hours.). 10 tablet 11  . vitamin B-12 (CYANOCOBALAMIN) 500 MCG tablet Take 500 mcg by mouth daily.     No current facility-administered medications on file prior to visit.    ALLERGIES: Allergies  Allergen Reactions  . Propranolol Hcl   . Diltiazem Hives    FAMILY HISTORY: Family History  Problem Relation Age of Onset  . Hypertension Father   . Other Father        "Big heart"  . Diabetes Mother   . Hypertension Mother   . Asthma Sister   . Other Paternal Grandfather        "Big heart"    SOCIAL HISTORY: Social History   Socioeconomic History  . Marital status: Married    Spouse name: Not on file  . Number of children: 2  . Years of education: certificate in early childhood education  . Highest education level: Some college, no degree  Occupational History  . Not on file  Tobacco Use  . Smoking status: Never Smoker  . Smokeless tobacco: Never Used  Vaping Use  . Vaping Use: Never used  Substance and Sexual Activity  . Alcohol use: No  . Drug use: No  . Sexual activity: Not Currently  Other Topics Concern  . Not on file  Social History Narrative   H.S graduate.    Unemployed due to COVID   Lives in two story home with husband and 2 sons   Right handed   Does not drinks coffee, drinks tea sometimes. Also drinks soda sometimes    Social Determinants of Health   Financial Resource Strain:   . Difficulty of Paying Living Expenses:   Food Insecurity:   . Worried About  Programme researcher, broadcasting/film/video in the Last Year:   . Barista in the Last Year:   Transportation Needs:   . Freight forwarder (Medical):   Marland Kitchen Lack of Transportation (Non-Medical):   Physical Activity:   . Days of Exercise per Week:   . Minutes of Exercise per Session:   Stress:   . Feeling of Stress :   Social Connections:   . Frequency of Communication with Friends and Family:   . Frequency of Social Gatherings with Friends and Family:   . Attends Religious Services:   . Active Member of Clubs or Organizations:   . Attends Banker Meetings:   Marland Kitchen Marital Status:   Intimate Partner Violence:   . Fear of Current or Ex-Partner:   . Emotionally Abused:   Marland Kitchen Physically Abused:   . Sexually Abused:     PHYSICAL EXAM: Blood pressure 122/78, pulse 76, height 5\' 4"  (1.626 m), weight 204 lb 8 oz (92.8 kg), last menstrual period 12/13/2019, SpO2 98 %. General: No acute distress.  Patient appears well-groomed.   Head:  Normocephalic/atraumatic Eyes:  Fundi examined but not visualized Neck: supple, no paraspinal tenderness, full range of motion Heart:  Regular rate and rhythm Lungs:  Clear to auscultation bilaterally Back: No paraspinal tenderness Neurological Exam: alert and oriented to person, place, and time. Attention span and concentration intact, recent and remote memory intact, fund of knowledge intact.  Speech fluent and not dysarthric, language intact.  CN II-XII intact. Bulk and tone normal, muscle strength 5/5 throughout.  Sensation to light touch, temperature and vibration intact.  Deep tendon reflexes 2+ throughout, toes downgoing.  Finger to nose and heel to shin testing intact.  Gait normal, Romberg negative.  IMPRESSION: 1.  Migraine without aura, without status migrainosus, not intractable 2.  Migraine aura without headache  PLAN: 1.  For preventative management, Aimovig 140mg  every 28 days 2.  For abortive therapy, Ubrelvy 100mg  3.  Limit use of pain  relievers to no more than 2 days out of week to prevent risk of rebound or medication-overuse headache. 4.  Keep headache diary 5.  Exercise, hydration, caffeine cessation, sleep hygiene, monitor for and avoid triggers 6. Follow up 6 months.   , DO  CC: , MD

## 2020-01-05 ENCOUNTER — Ambulatory Visit: Payer: BC Managed Care – PPO | Admitting: Cardiology

## 2020-02-07 ENCOUNTER — Ambulatory Visit: Payer: BC Managed Care – PPO | Admitting: Neurology

## 2020-03-05 ENCOUNTER — Ambulatory Visit: Payer: BC Managed Care – PPO | Admitting: Cardiology

## 2020-03-20 DIAGNOSIS — I10 Essential (primary) hypertension: Secondary | ICD-10-CM | POA: Insufficient documentation

## 2020-03-20 NOTE — Progress Notes (Signed)
Subjective:   Ann Frey, female    DOB: 09-Dec-1985, 34 y.o.   MRN: [id number]   Chief Complaint  Patient presents with   Chest Pain   Follow-up   34 y.o. African American female with palpitations, chest pain.  Patient has not had any recent chest pain. She generally feels "overwhelmed" and wants to know if this could be related to her medication. Blood pressure elevated today, generally well controlled.  Current Outpatient Medications on File Prior to Visit  Medication Sig Dispense Refill   Erenumab-aooe (AIMOVIG) 140 MG/ML SOAJ Inject 140 mg into the skin every 30 (thirty) days. 1 pen 11   esomeprazole (NEXIUM) 20 MG capsule Take 1 capsule (20 mg total) by mouth daily at 12 noon. 30 capsule 2   ferrous sulfate 324 MG TBEC Take 65 mg by mouth daily.     metoprolol succinate (TOPROL-XL) 50 MG 24 hr tablet Take 1 tablet (50 mg total) by mouth daily. Take with or immediately following a meal. 90 tablet 3   naproxen sodium (ANAPROX) 550 MG tablet TAKE 1 TABLET BY MOUTH EVERY 12 HOURS AS NEEDED 16 tablet 0   Ubrogepant (UBRELVY) 100 MG TABS Take 1 tablet by mouth daily as needed (May repeat  in 2 hours if needed. Maximum 2 tablets in 24 hours.). 10 tablet 11   vitamin B-12 (CYANOCOBALAMIN) 500 MCG tablet Take 500 mcg by mouth daily.     No current facility-administered medications on file prior to visit.    Cardiovascular studies:  EKG 12/29/2019: Sinus rhythm 91 bpm  Low voltage in precordial leads.  Left atrial enlargement.  Nonspecific T-abnormality.   Event monitor 05/27/2019 - 06/25/2019: Diagnostic time: 97%  Dominant rhythm: Sinus. HR 54-145 bpm. Avg HR 80 bpm. No atrial fibrillation/atrial flutter/SVT/VT/high grade AV block, sinus pause >3sec noted. Symptoms reported: None  EKG treadmill stress test 10/06/2017: 8 METS. Dyspnea and fatigue. EKG negative for ischemia  Echocardiogram 10/05/2017: EF 55%. Mild mitral regurgitation.  Recent  labs: 12/25/2019: Glucose 88, BUN/Cr 8/0.87. EGFR >60. Na/K 139/4.3. Rest of the CMP normal H/H 13/41. MCV 80. Platelets 292 TSH 2.6 normal   Review of Systems  Cardiovascular: Positive for chest pain and palpitations. Negative for dyspnea on exertion, leg swelling and syncope.         Vitals:   03/21/20 0836  BP: (!) 151/86  Pulse: 88  Resp: 16  SpO2: 100%     Body mass index is 37.08 kg/m. Filed Weights   03/21/20 0836  Weight: 216 lb (98 kg)     Objective:    Physical Exam Vitals and nursing note reviewed.  Constitutional:      Appearance: She is well-developed.  Neck:     Vascular: No JVD.  Cardiovascular:     Rate and Rhythm: Normal rate and regular rhythm.     Pulses: Intact distal pulses.     Heart sounds: Normal heart sounds. No murmur heard.   Pulmonary:     Effort: Pulmonary effort is normal.     Breath sounds: Normal breath sounds. No wheezing or rales.           Assessment & Recommendations:   34 y.o. African American female with palpitations, chest pain.  No recurrence of chest pain or palpitations. Previously unremarkable cardiac work-up including stress test and echocardiogram in 2019.  Symptoms are atypical for angina, most likely reflecting GERD.  Okay to continue PPI.  I do not think her symptoms of feeling overwhelmed are  related to medication. Suspect anxiety. Defer management to PCP.  I will see her on as needed basis.   Nigel Mormon, MD [facility] Pager: [phone] Office: [phone]

## 2020-03-21 ENCOUNTER — Encounter: Payer: Self-pay | Admitting: Cardiology

## 2020-03-21 ENCOUNTER — Other Ambulatory Visit: Payer: Self-pay

## 2020-03-21 ENCOUNTER — Ambulatory Visit: Payer: Managed Care, Other (non HMO) | Admitting: Cardiology

## 2020-03-21 VITALS — BP 151/86 | HR 88 | Resp 16 | Ht 64.0 in | Wt 216.0 lb

## 2020-03-21 DIAGNOSIS — I10 Essential (primary) hypertension: Secondary | ICD-10-CM

## 2020-03-21 DIAGNOSIS — R002 Palpitations: Secondary | ICD-10-CM

## 2020-03-21 DIAGNOSIS — R0789 Other chest pain: Secondary | ICD-10-CM

## 2020-03-22 NOTE — Progress Notes (Signed)
Ann Frey (Key: [id number])  Your information has been submitted and will be reviewed by [facility]. You may close this dialog, return to your dashboard, and perform other tasks. An electronic determination will be received in CoverMyMeds within 72-120 hours. You can see the latest determination by locating this request on your dashboard or by reopening this request. You will receive a fax copy of the determination. If Rosann Auerbach has not responded in 120 hours, contact [employer] at [phone].

## 2020-03-29 ENCOUNTER — Encounter: Payer: Self-pay | Admitting: Neurology

## 2020-03-29 NOTE — Progress Notes (Addendum)
Received fax from Stana Bunting approved valid until 03/29/21.

## 2020-03-29 NOTE — Progress Notes (Signed)
Ann Frey (Key: [id number]) Rx #: [id number] Aimovig 140MG /ML auto-injectors   Form [place] Form [phone] NCPDP) Created 10 days ago Sent to Plan 7 days ago Plan Response 7 days ago Submit Clinical Questions 7 days ago Determination Favorable 22 hours ago Message from Plan ([id number];Review Type:Prior Auth;Coverage Start Date:03/22/2020;Coverage End Date:09/24/2020;

## 2020-04-23 ENCOUNTER — Telehealth: Payer: Self-pay | Admitting: Neurology

## 2020-04-23 NOTE — Telephone Encounter (Signed)
Returned patients call and was informed that she has been having constipation, burping, and burning in the middle of her chest since she has started the Aimovig injections. Patient has been using Milk of Magnesia and it has been alittle helpful with constipation. Patient wanted to know if there is anything else she can do? She expressed that the milk of magnesia has not helped with her burping or chest burning. Informed patient that I will send Dr. Everlena Cooper a message and give her a call back once I hear back.

## 2020-04-23 NOTE — Telephone Encounter (Signed)
Aimovig definitely may cause constipation.  I don't know about the burping/indigestion.  If it is not manageable, we can switch to a different medication in the same family, such as Emgality.  The first dose requires 2 injections but it will only be 1 injection thereafter.

## 2020-04-23 NOTE — Telephone Encounter (Signed)
Patient called with concerns about constipation. She'd like to know if taking the Aimovig injection monthly could be contributing to it?

## 2020-04-24 NOTE — Telephone Encounter (Signed)
Spoke to pt, Pt wants to research and give [place] call back

## 2020-07-03 NOTE — Progress Notes (Signed)
NEUROLOGY FOLLOW UP OFFICE NOTE  Ann Frey [id number]   Subjective:  Ann Frey is a 34year old right-handed woman with sinus tachycardia who follows up for migraines.  UPDATE: They are more frequently moderate.  They last a couple of hours.  She has been averaging 1 every 4 to 6 weeks.  They may occur more frequently the last week prior to next injection.  She hasn't had a severe migraine keeping her in bed for about 5 months.  She noted constipation and indigestion, which she has attributed to Aimovig.   Current NSAIDS:Naproxen 550 mg Current analgesics:None Current triptans:none Current ergotamine:None Current anti-emetic:None Current muscle relaxants:None Current anti-anxiolytic:None Current sleep aide:None Current Antihypertensive medications:Cardizem Current Antidepressant medications:None Current Anticonvulsant medications:none Current anti-CGRP:Aimovig 140mg ; Ubrelvy 100mg  Current Vitamins/Herbal/Supplements:None Current Antihistamines/Decongestants:None Other therapy:None  Caffeine:No coffee Alcohol:No Smoker:No Diet:Drinks plenty of water Exercise:Walks daily Depression:No; Anxiety:Yes Other pain:None times neck pain Sleep hygiene:Good  HISTORY: She began having headaches intermittentlysince 2013. Location: Temples/forehead bilateral. Quality: pounding. Initial intensity: 10/10. Aura: no. Prodrome: no. Associated symptoms:Spinning sensation, nausea, phonophobia. Whitening/fogginess of vision. No vomiting, diplopia, slurred speech, focal numbness or weakness. Initial Duration: 3 days. Initial Frequency: Twice a year but has daily dull headaches. Triggers: Fatigue.Also chemicals from cleaning fluids.Relieving factors: None. Activity: Goes to hospital when severe.  Past NSAIDS:no Past analgesics:Tylenol Past abortive triptans:sumatriptan (did not tolerate); rizatriptan (palpitations) Past  abortive ergotamine:no Past muscle relaxants:no Past anti-emetic:no Past antihypertensive medications:Propranolol ER 80mg  (shortness of breath and rash), metoprolol (lightheadedness) Past antidepressant medications:Nortriptyline 10mg  (chest pain) Past anticonvulsant medications:topiramate 150mg  (irritability, memory deficits, dry mouth, paresthesias) Past anti-CGRP:no Past vitamins/Herbal/Supplements:no Past antihistamines/decongestants:no Other past therapies:no  She has history of other somatic symptoms, particularly chest pain, palpitations and shortness of breath, which has been worked up by cardiology and testing negative.  07/28/17. She developed sudden tingling of the entire upper and lower face which then involved the entire body with burning and redness in the hands. She had associated generalized weakness, spinning sensation, nausea, shortness of breath and visual disturbance described as blurred vision with dimming of vision. She denied headache at the time. Head CT was personally reviewed and was unremarkable. EKG and labs were unremarkable. Since then, she continues to feel generalized weakness, dysesthesias of the face and entire body/extremities and lightheadedness. MRI of brain with and without contrast was performed on 08/19/17, which was personally reviewed and demonstrated nonspecific mild scattered small subcortical white matter foci, likely related to migraine, and incidental developmental venous anomalyOtherwise, no acute abnormalities.Topiramate was discontinued but spells persisted.  PAST MEDICAL HISTORY: Past Medical History:  Diagnosis Date  . Hypertension   . Migraines   . No pertinent past medical history     MEDICATIONS: Current Outpatient Medications on File Prior to Visit  Medication Sig Dispense Refill  . Erenumab-aooe (AIMOVIG) 140 MG/ML SOAJ Inject 140 mg into the skin every 30 (thirty) days. 1 pen 11  . esomeprazole  (NEXIUM) 20 MG capsule Take 1 capsule (20 mg total) by mouth daily at 12 noon. 30 capsule 2  . ferrous sulfate 324 MG TBEC Take 65 mg by mouth daily.    . metoprolol succinate (TOPROL-XL) 50 MG 24 hr tablet Take 1 tablet (50 mg total) by mouth daily. Take with or immediately following a meal. 90 tablet 3  . naproxen sodium (ANAPROX) 550 MG tablet TAKE 1 TABLET BY MOUTH EVERY 12 HOURS AS NEEDED 16 tablet 0  . Ubrogepant (UBRELVY) 100 MG TABS Take 1 tablet by mouth daily as needed (May  repeat  in 2 hours if needed. Maximum 2 tablets in 24 hours.). 10 tablet 11  . vitamin B-12 (CYANOCOBALAMIN) 500 MCG tablet Take 500 mcg by mouth daily.     No current facility-administered medications on file prior to visit.    ALLERGIES: Allergies  Allergen Reactions  . Propranolol Hcl   . Diltiazem Hives    FAMILY HISTORY: Family History  Problem Relation Age of Onset  . Hypertension Father   . Other Father        "Big heart"  . Diabetes Mother   . Hypertension Mother   . Asthma Sister   . Other Paternal Grandfather        "Big heart"    SOCIAL HISTORY: Social History   Socioeconomic History  . Marital status: Married    Spouse name: Not on file  . Number of children: 2  . Years of education: certificate in early childhood education  . Highest education level: Some college, no degree  Occupational History  . Not on file  Tobacco Use  . Smoking status: Never Smoker  . Smokeless tobacco: Never Used  Vaping Use  . Vaping Use: Never used  Substance and Sexual Activity  . Alcohol use: No  . Drug use: No  . Sexual activity: Not Currently  Other Topics Concern  . Not on file  Social History Narrative   H.S graduate.    Unemployed due to COVID   Lives in two story home with husband and 2 sons   Right handed   Does not drinks coffee, drinks tea sometimes. Also drinks soda sometimes    Social Determinants of Health   Financial Resource Strain: Not on file  Food Insecurity: Not on  file  Transportation Needs: Not on file  Physical Activity: Not on file  Stress: Not on file  Social Connections: Not on file  Intimate Partner Violence: Not on file     Objective:  Blood pressure 133/81, pulse 100, height 5\' 3"  (1.6 m), weight 219 lb 12.8 oz (99.7 kg), SpO2 93 %. General: No acute distress.  Patient appears well-groomed.     Assessment/Plan:   1.  Migraine without aura, without status migrainosus, not intractable 2.  Migraine with aura, without status migrainosus, not intractable  1.  Migraine prevention:  Due to constipation, stop Aimovig.  Will start Emgality every 28 days 2.  Migraine rescue:  Ubrelvy 100mg  3.  Limit use of pain relievers to no more than 2 days out of week to prevent risk of rebound or medication-overuse headache. 4.  Keep headache diary 5.   Exercise, hydration, caffeine cessation, sleep hygiene, monitor for and avoid triggers 6.  Follow up 6 months   , DO  CC: , MD

## 2020-07-05 ENCOUNTER — Ambulatory Visit (INDEPENDENT_AMBULATORY_CARE_PROVIDER_SITE_OTHER): Payer: Managed Care, Other (non HMO) | Admitting: Neurology

## 2020-07-05 ENCOUNTER — Encounter: Payer: Self-pay | Admitting: Neurology

## 2020-07-05 ENCOUNTER — Other Ambulatory Visit: Payer: Self-pay

## 2020-07-05 VITALS — BP 133/81 | HR 100 | Ht 63.0 in | Wt 219.8 lb

## 2020-07-05 DIAGNOSIS — G43009 Migraine without aura, not intractable, without status migrainosus: Secondary | ICD-10-CM

## 2020-07-05 MED ORDER — EMGALITY 120 MG/ML ~~LOC~~ SOAJ: 120.0000 mg | SUBCUTANEOUS | 5 refills | Status: DC

## 2020-07-05 NOTE — Patient Instructions (Signed)
1.  Stop Aimovig.  Instead, on Jan 13, take Emgality.  For the first dose, you need to give yourself 2 injections but then only 1 injection every 28 days 2.  Ubrelvy as needed 3.  Limit use of pain relievers to no more than 2 days out of week to prevent risk of rebound or medication-overuse headache. 4.  Keep headache diary 5.  Follow up 6 months.

## 2020-07-12 ENCOUNTER — Encounter: Payer: Self-pay | Admitting: Neurology

## 2020-07-12 NOTE — Progress Notes (Addendum)
Ann Frey - PA Case ID: [id number] - Rx #: [id number] Need help? Call us at [phone] Outcome Approvedtoday [id number];Review Type:Prior Auth;Coverage Start Date:07/11/2020;Coverage End Date:07/30/2021; Drug Emgality 120MG /ML syringes (migraine) Form PA Form [phone] [place]) Original Claim Info 57

## 2020-07-16 ENCOUNTER — Telehealth: Payer: Self-pay | Admitting: Neurology

## 2020-07-16 NOTE — Telephone Encounter (Signed)
Patient called with questions about her Emgality that was not approved. She said, "I'd like to speak with a nurse about what I should do now since I am due for an injection soon."

## 2020-07-20 NOTE — Telephone Encounter (Signed)
Just called to check the status of the Appeal and it is still pending. Thanks! Maybe do we have any samples for her in the mean time?

## 2020-07-20 NOTE — Telephone Encounter (Signed)
Has this been completed?  Sending to clinical staff for review: Okay to sign/close encounter or is further follow up needed? 

## 2020-07-20 NOTE — Telephone Encounter (Signed)
LVM for pt, to call office back. Pt okay to pick up samples

## 2020-07-25 ENCOUNTER — Telehealth: Payer: Self-pay | Admitting: Neurology

## 2020-07-25 NOTE — Telephone Encounter (Signed)
Per note 07/20/20 Appeal pending.

## 2020-07-30 NOTE — Telephone Encounter (Signed)
Patient has been approved for the [facility] medication for the PA.

## 2020-09-29 ENCOUNTER — Other Ambulatory Visit: Payer: Self-pay | Admitting: Neurology

## 2020-11-23 ENCOUNTER — Other Ambulatory Visit: Payer: Self-pay | Admitting: Cardiology

## 2020-11-23 ENCOUNTER — Other Ambulatory Visit: Payer: Self-pay | Admitting: Neurology

## 2020-11-23 DIAGNOSIS — R0789 Other chest pain: Secondary | ICD-10-CM

## 2020-11-23 DIAGNOSIS — I10 Essential (primary) hypertension: Secondary | ICD-10-CM

## 2021-01-01 NOTE — Progress Notes (Signed)
NEUROLOGY FOLLOW UP OFFICE NOTE  Ann Frey [id number]  Assessment/Plan:   Migraine without aura, without status migrainosus, not intractable Migraine with aura, without status migrainosus, not intractable  1.Migraine prevention:  Emgality Q28d 2.Migraine rescue:  Ubrelvy 100mg  3. Limit use of pain relievers to no more than 2 days out of week to prevent risk of rebound or medication-overuse headache. 4. Keep headache diary 5.  Follow up 6 months  Subjective:  Ann Frey is a 35 year old right-handed woman with sinus tachycardia who follows up for migraines.   UPDATE: Due to constipation, switched from Aimovig to [facility] in December.  She is averaging 2 a month, lasting a couple of hours with Ubrelvy, moderate.  Sometimes, she has a strange sensation of something moving in her head.  It occurs close to the next injection.  Constipation is not resolved but much better and manageable   Current NSAIDS: Naproxen 550 mg (has not taken for a long time) Current analgesics: None Current triptans: none Current ergotamine: None Current anti-emetic: None Current muscle relaxants: None Current anti-anxiolytic: None Current sleep aide: None Current Antihypertensive medications: Cardizem Current Antidepressant medications: None Current Anticonvulsant medications: none Current anti-CGRP: Emgality Ubrelvy 100mg  Current Vitamins/Herbal/Supplements: None Current Antihistamines/Decongestants: None Other therapy: None   Caffeine: No coffee Alcohol: No Smoker: No Diet: Drinks plenty of water Exercise: Walks daily Depression: No; Anxiety: Yes Other pain: None times neck pain Sleep hygiene: Good   HISTORY:  She began having headaches intermittently since 2013.  Location:  Temples/forehead bilateral.  Quality:  pounding.  Initial intensity:  10/10.  Aura:  no.  Prodrome:  no.  Associated symptoms:  Spinning sensation, nausea, phonophobia.  Whitening/fogginess of vision.  No vomiting,  diplopia, slurred speech, focal numbness or weakness.  Initial Duration:  3 days.  Initial Frequency:  Twice a year but has daily dull headaches.  Triggers:  Fatigue.  Also chemicals from cleaning fluids.  Relieving factors:  None.  Activity:  Goes to hospital when severe.       Past NSAIDS:  no Past analgesics:  Tylenol Past abortive triptans:  sumatriptan (did not tolerate); rizatriptan (palpitations) Past abortive ergotamine:  no Past muscle relaxants:  no Past anti-emetic:  no Past antihypertensive medications:  Propranolol ER 80mg  (shortness of breath and rash), metoprolol (lightheadedness) Past antidepressant medications:  Nortriptyline 10mg  (chest pain) Past anticonvulsant medications:  topiramate 150mg  (irritability, memory deficits, dry mouth, paresthesias) Past anti-CGRP:  Aimovig (constipation) Past vitamins/Herbal/Supplements:  no Past antihistamines/decongestants:  no Other past therapies:  no   She has history of other somatic symptoms, particularly chest pain, palpitations and shortness of breath, which has been worked up by cardiology and testing negative.   07/28/17.  She developed sudden tingling of the entire upper and lower face which then involved the entire body with burning and redness in the hands.  She had associated generalized weakness, spinning sensation, nausea, shortness of breath and visual disturbance described as blurred vision with dimming of vision.  She denied headache at the time.  Head CT was personally reviewed and was unremarkable.  EKG and labs were unremarkable.  Since then, she continues to feel generalized weakness, dysesthesias of the face and entire body/extremities and lightheadedness.  MRI of brain with and without contrast was performed on 08/19/17, which was personally reviewed and demonstrated nonspecific mild scattered small subcortical white matter foci, likely related to migraine, and incidental developmental venous anomaly  Otherwise, no acute  abnormalities.  Topiramate was discontinued but spells persisted.  PAST MEDICAL HISTORY: Past Medical History:  Diagnosis Date   Hypertension    Migraines    No pertinent past medical history     MEDICATIONS: Current Outpatient Medications on File Prior to Visit  Medication Sig Dispense Refill   calcium-vitamin D (OSCAL WITH D) 500-200 MG-UNIT tablet Take 1 tablet by mouth.     esomeprazole (NEXIUM) 20 MG capsule Take 1 capsule (20 mg total) by mouth daily at 12 noon. (Patient not taking: Reported on 07/05/2020) 30 capsule 2   ferrous sulfate 324 MG TBEC Take 65 mg by mouth daily.     Galcanezumab-gnlm (EMGALITY) 120 MG/ML SOAJ Inject 120 mg into the skin every 28 (twenty-eight) days. 1.12 mL 5   metoprolol succinate (TOPROL-XL) 50 MG 24 hr tablet TAKE 1 TABLET BY MOUTH ONCE DAILY WITH  FOOD  OR  IMMEDIATELY  FOLLOWING  A  MEAL 90 tablet 0   naproxen sodium (ANAPROX) 550 MG tablet TAKE 1 TABLET BY MOUTH EVERY 12 HOURS AS NEEDED 16 tablet 0   UBRELVY 100 MG TABS TAKE ONE TABLET BY MOUTH ONCE DAILY AS NEEDED ( MAY REPEAT IN 2 HOURS IF NEEDED, MAXIMUM 2 TABS IN 24 HOURS) 10 tablet 0   vitamin B-12 (CYANOCOBALAMIN) 500 MCG tablet Take 500 mcg by mouth daily.     No current facility-administered medications on file prior to visit.    ALLERGIES: Allergies  Allergen Reactions   Propranolol Hcl    Diltiazem Hives    FAMILY HISTORY: Family History  Problem Relation Age of Onset   Hypertension Father    Other Father        "Big heart"   Diabetes Mother    Hypertension Mother    Asthma Sister    Other Paternal Grandfather        "Big heart"      Objective:  Blood pressure 128/85, pulse 92, resp. rate 20, height 5\' 3"  (1.6 m), weight 216 lb (98 kg), SpO2 98 %. General: No acute distress.  Patient appears well-groomed.   Head:  Normocephalic/atraumatic Eyes:  Fundi examined but not visualized Neck: supple, no paraspinal tenderness, full range of motion Heart:  Regular rate  and rhythm Lungs:  Clear to auscultation bilaterally Back: No paraspinal tenderness Neurological Exam: alert and oriented to person, place, and time.  Speech fluent and not dysarthric, language intact.  CN II-XII intact. Bulk and tone normal, muscle strength 5/5 throughout.  Sensation to light touch intact.  Deep tendon reflexes 2+ throughout.  Finger to nose testing intact.  Gait normal, Romberg negative.   , DO  CC: Shon Millet, MD

## 2021-01-03 ENCOUNTER — Other Ambulatory Visit: Payer: Self-pay

## 2021-01-03 ENCOUNTER — Encounter: Payer: Self-pay | Admitting: Neurology

## 2021-01-03 ENCOUNTER — Ambulatory Visit: Payer: 59 | Admitting: Neurology

## 2021-01-03 VITALS — BP 128/85 | HR 92 | Resp 20 | Ht 63.0 in | Wt 216.0 lb

## 2021-01-03 DIAGNOSIS — G43009 Migraine without aura, not intractable, without status migrainosus: Secondary | ICD-10-CM | POA: Diagnosis not present

## 2021-01-03 NOTE — Patient Instructions (Signed)
Continue Emgality Use Bernita Raisin if needed

## 2021-01-25 ENCOUNTER — Other Ambulatory Visit: Payer: Self-pay | Admitting: Neurology

## 2021-02-09 ENCOUNTER — Encounter (HOSPITAL_COMMUNITY): Payer: Self-pay | Admitting: Emergency Medicine

## 2021-02-09 ENCOUNTER — Emergency Department (HOSPITAL_COMMUNITY)
Admission: EM | Admit: 2021-02-09 | Discharge: 2021-02-09 | Disposition: A | Discharge status: Home / Self Care | Payer: Managed Care, Other (non HMO) | Attending: Emergency Medicine | Admitting: Emergency Medicine

## 2021-02-09 ENCOUNTER — Emergency Department (HOSPITAL_COMMUNITY): Payer: Managed Care, Other (non HMO)

## 2021-02-09 ENCOUNTER — Other Ambulatory Visit: Payer: Self-pay

## 2021-02-09 DIAGNOSIS — Z79899 Other long term (current) drug therapy: Secondary | ICD-10-CM | POA: Insufficient documentation

## 2021-02-09 DIAGNOSIS — M79602 Pain in left arm: Secondary | ICD-10-CM | POA: Insufficient documentation

## 2021-02-09 DIAGNOSIS — K219 Gastro-esophageal reflux disease without esophagitis: Secondary | ICD-10-CM

## 2021-02-09 DIAGNOSIS — N9489 Other specified conditions associated with female genital organs and menstrual cycle: Secondary | ICD-10-CM | POA: Insufficient documentation

## 2021-02-09 DIAGNOSIS — I10 Essential (primary) hypertension: Secondary | ICD-10-CM | POA: Diagnosis not present

## 2021-02-09 DIAGNOSIS — R079 Chest pain, unspecified: Secondary | ICD-10-CM | POA: Insufficient documentation

## 2021-02-09 LAB — CBG MONITORING, ED: Glucose-Capillary: 76 mg/dL (ref 70–99)

## 2021-02-09 LAB — CBC
HCT: 38.6 % (ref 36.0–46.0)
Hemoglobin: 12.2 g/dL (ref 12.0–15.0)
MCH: 25.4 pg — ABNORMAL LOW (ref 26.0–34.0)
MCHC: 31.6 g/dL (ref 30.0–36.0)
MCV: 80.4 fL (ref 80.0–100.0)
Platelets: 292 10*3/uL (ref 150–400)
RBC: 4.8 MIL/uL (ref 3.87–5.11)
RDW: 13.7 % (ref 11.5–15.5)
WBC: 5.4 10*3/uL (ref 4.0–10.5)
nRBC: 0 % (ref 0.0–0.2)

## 2021-02-09 LAB — BASIC METABOLIC PANEL
Anion gap: 5 (ref 5–15)
BUN: 8 mg/dL (ref 6–20)
CO2: 28 mmol/L (ref 22–32)
Calcium: 9.6 mg/dL (ref 8.9–10.3)
Chloride: 104 mmol/L (ref 98–111)
Creatinine, Ser: 0.83 mg/dL (ref 0.44–1.00)
GFR, Estimated: >60 mL/min (ref >60)
Glucose, Bld: 95 mg/dL (ref 70–99)
Potassium: 4.1 mmol/L (ref 3.5–5.1)
Sodium: 137 mmol/L (ref 135–145)

## 2021-02-09 LAB — TROPONIN I (HIGH SENSITIVITY)
Troponin I (High Sensitivity): 3 ng/L (ref <18)
Troponin I (High Sensitivity): 3 ng/L (ref <18)

## 2021-02-09 LAB — I-STAT BETA HCG BLOOD, ED (MC, WL, AP ONLY): I-stat hCG, quantitative: <5 m[IU]/mL (ref <5)

## 2021-02-09 MED ORDER — ESOMEPRAZOLE MAGNESIUM 40 MG PO CPDR: 40.0000 mg | DELAYED_RELEASE_CAPSULE | Freq: Every day | ORAL | 0 refills | Status: DC

## 2021-02-09 NOTE — ED Provider Notes (Signed)
[facility] EMERGENCY DEPARTMENT Provider Note   CSN: [id number] Arrival date & time: 02/09/21  1223     History No chief complaint on file.   Ann Frey is a 35 y.o. female hx of HTN, here presenting with left-sided chest pain and arm pain.  Patient states that she has some left-sided chest pain for the last 2 to 3 days.  She states that it is burning sensation and also she feels " chills in her heart" but denies actual fevers.  Patient states that sometimes the pain radiates from her abdomen to her chest.  Patient has some left arm tingling as well.   The history is provided by the patient.      Past Medical History:  Diagnosis Date   Hypertension    Migraines    No pertinent past medical history     Patient Active Problem List   Diagnosis Date Noted   Essential hypertension 03/20/2020   Gastroesophageal reflux disease 12/29/2019   Palpitations 05/26/2019   Exertional dyspnea 12/29/2018   Sinus tachycardia 12/02/2018   Elevated blood pressure reading in office without diagnosis of hypertension 12/02/2018   Atypical chest pain 12/02/2018   Vestibular migraine 06/27/2015   Severe preeclampsia 05/26/2011   History of cesarean section 04/28/2011   Transient hypertension of pregnancy, antepartum 04/01/2011   Supervision of high-risk pregnancy 03/03/2011    Past Surgical History:  Procedure Laterality Date   CESAREAN SECTION     CESAREAN SECTION  05/25/2011   Procedure: CESAREAN SECTION;  Surgeon: Lesly Dukes, MD;  Location: [facility];  Service: Gynecology;  Laterality: N/A;  Repeat cesarean section with delivery of baby boy at 42. Apgars 6/9.     OB History     Gravida  2   Para  2   Term  2   Preterm  0   AB  0   Living  2      SAB  0   IAB  0   Ectopic  0   Multiple  0   Live Births  2           Family History  Problem Relation Age of Onset   Hypertension Father    Other Father        "Big heart"   Diabetes  Mother    Hypertension Mother    Asthma Sister    Other Paternal Grandfather        "Big heart"    Social History   Tobacco Use   Smoking status: Never   Smokeless tobacco: Never  Vaping Use   Vaping Use: Never used  Substance Use Topics   Alcohol use: No   Drug use: No    Home Medications Prior to Admission medications   Medication Sig Start Date End Date Taking? Authorizing Provider  calcium-vitamin D (OSCAL WITH D) 500-200 MG-UNIT tablet Take 1 tablet by mouth.    [provider]  EMGALITY 120 MG/ML SOSY INJECT 120 MG INTO THE SKIN EVERY 28 DAYS 01/25/21   Everlena Cooper, Adam R, DO  esomeprazole (NEXIUM) 20 MG capsule Take 1 capsule (20 mg total) by mouth daily at 12 noon. Patient not taking: No sig reported 12/29/19   Patwardhan, Anabel Bene, MD  ferrous sulfate 324 MG TBEC Take 65 mg by mouth daily.    [provider]  metoprolol succinate (TOPROL-XL) 50 MG 24 hr tablet TAKE 1 TABLET BY MOUTH ONCE DAILY WITH  FOOD  OR  IMMEDIATELY  FOLLOWING  A  MEAL 11/23/20   Patwardhan, Manish J, MD  naproxen sodium (ANAPROX) 550 MG tablet TAKE 1 TABLET BY MOUTH EVERY 12 HOURS AS NEEDED 09/08/19   Jaffe, Adam R, DO  UBRELVY 100 MG TABS TAKE 1 TABLET BY MOUTH ONCE DAILY AS NEEDED **  MAY  REPEAT  IN  2  HOURS  IF  NEEDED,  MAXIMUM  2  TABLETS  IN  24  HOURS  ** 01/25/21   Everlena Cooper, Adam R, DO  vitamin B-12 (CYANOCOBALAMIN) 500 MCG tablet Take 500 mcg by mouth daily.    [provider]    Allergies    Propranolol hcl, Diltiazem, and Propranolol  Review of Systems   Review of Systems  Cardiovascular:  Positive for chest pain.  All other systems reviewed and are negative.  Physical Exam Updated Vital Signs BP 125/78   Pulse 75   Temp 98.7 F (37.1 C) (Oral)   Resp 10   LMP 02/01/2021   SpO2 100%   Physical Exam Vitals and nursing note reviewed.  Constitutional:      Comments: Well appearing   HENT:     Head: Normocephalic.     Nose: Nose normal.     Mouth/Throat:      Mouth: Mucous membranes are moist.  Eyes:     Extraocular Movements: Extraocular movements intact.     Pupils: Pupils are equal, round, and reactive to light.  Cardiovascular:     Rate and Rhythm: Normal rate and regular rhythm.     Pulses: Normal pulses.     Heart sounds: Normal heart sounds.  Pulmonary:     Effort: Pulmonary effort is normal.     Breath sounds: Normal breath sounds.  Abdominal:     General: Abdomen is flat.     Palpations: Abdomen is soft.  Musculoskeletal:        General: Normal range of motion.     Cervical back: Normal range of motion and neck supple.  Skin:    General: Skin is warm.     Capillary Refill: Capillary refill takes less than 2 seconds.  Neurological:     General: No focal deficit present.     Mental Status: She is oriented to person, place, and time.  Psychiatric:        Mood and Affect: Mood normal.        Behavior: Behavior normal.    ED Results / Procedures / Treatments   Labs (all labs ordered are listed, but only abnormal results are displayed) Labs Reviewed  CBC - Abnormal; Notable for the following components:      Result Value   MCH 25.4 (*)    All other components within normal limits  BASIC METABOLIC PANEL  I-STAT BETA HCG BLOOD, ED ([facility], [facility], [facility] ONLY)  CBG MONITORING, ED  TROPONIN I (HIGH SENSITIVITY)  TROPONIN I (HIGH SENSITIVITY)    EKG EKG Interpretation  Date/Time:  Saturday February 09 2021 12:44:42 EDT Ventricular Rate:  87 PR Interval:  162 QRS Duration: 80 QT Interval:  340 QTC Calculation: 409 R Axis:   62 Text Interpretation: Normal sinus rhythm Possible Left atrial enlargement Nonspecific ST and T wave abnormality Abnormal ECG Confirmed by Virgina Norfolk ([id number]) on 02/09/2021 2:22:20 PM  Radiology DG Chest 2 View  Result Date: 02/09/2021 CLINICAL DATA:  Chest pain radiating to the left side. Shortness of breath and dizziness for 2 days. EXAM: CHEST - 2 VIEW COMPARISON:  Chest radiograph dated 12/25/2019.  FINDINGS: The heart size and mediastinal contours are within normal limits. Both lungs are clear. The visualized skeletal structures are unremarkable. IMPRESSION: No active cardiopulmonary disease. Electronically Signed   By: Romona Curls M.D.   On: 02/09/2021 13:24    Procedures Procedures   Medications Ordered in ED Medications - No data to display  ED Course  I have reviewed the triage vital signs and the nursing notes.  Pertinent labs & imaging results that were available during my care of the patient were reviewed by me and considered in my medical decision making (see chart for details).    MDM Rules/Calculators/A&P                          Anh Bigos is a 35 y.o. female here presenting with left-sided chest pain and arm pain.  The sense of burning sensation.  Going on for 2 to 3 days.  Patient appears well.  Patient is not tachycardic and vitals stable.  Low suspicion for ACS.  I doubt PE or dissection.  Troponin negative x2.  Chest x-ray clear.  I think likely reflux.  Will start on Nexium.  We will have patient follow-up with PCP  Final Clinical Impression(s) / ED Diagnoses Final diagnoses:  None    Rx / DC Orders ED Discharge Orders     None        Charlynne Pander, MD 02/09/21 1650

## 2021-02-09 NOTE — ED Triage Notes (Signed)
Pt reports L sided chest pain, L arm and leg tingling, nausea, and "chills in my heart" x 2-3 days.

## 2021-02-09 NOTE — Discharge Instructions (Addendum)
Take nexium daily   See your doctor for follow up   Return to ER if you have worse chest pain, shortness of breath

## 2021-03-05 ENCOUNTER — Other Ambulatory Visit: Payer: Self-pay | Admitting: Cardiology

## 2021-03-05 DIAGNOSIS — I10 Essential (primary) hypertension: Secondary | ICD-10-CM

## 2021-03-05 DIAGNOSIS — R0789 Other chest pain: Secondary | ICD-10-CM

## 2021-03-18 ENCOUNTER — Telehealth: Payer: Self-pay

## 2021-03-18 NOTE — Telephone Encounter (Signed)
New message   Message from Plan  CaseId:[id number];Status:Approved;Review Type:Prior Auth;Coverage Start Date:03/18/2021;Coverage End Date:03/18/2022;  [name] (Key: [id number]) Bernita Raisin 100MG  tablets   Form PA Form [id number] [employer]CPDP) Created 2 days ago Sent to Plan 6 minutes ago Plan Response 6 minutes ago Submit Clinical Questions 1 minute ago Determination Favorable 1 minute ago

## 2021-04-13 ENCOUNTER — Other Ambulatory Visit: Payer: Self-pay | Admitting: Cardiology

## 2021-04-13 DIAGNOSIS — R0789 Other chest pain: Secondary | ICD-10-CM

## 2021-04-13 DIAGNOSIS — I10 Essential (primary) hypertension: Secondary | ICD-10-CM

## 2021-04-23 ENCOUNTER — Other Ambulatory Visit: Payer: Self-pay | Admitting: Neurology

## 2021-04-24 ENCOUNTER — Telehealth: Payer: Self-pay | Admitting: Neurology

## 2021-04-24 MED ORDER — EMGALITY 120 MG/ML ~~LOC~~ SOSY: PREFILLED_SYRINGE | SUBCUTANEOUS | 4 refills | Status: DC

## 2021-04-24 NOTE — Telephone Encounter (Signed)
Refill sent.

## 2021-04-24 NOTE — Telephone Encounter (Signed)
Pt said she would like a refill on her emgality.

## 2021-04-25 ENCOUNTER — Other Ambulatory Visit: Payer: Self-pay | Admitting: Neurology

## 2021-04-29 ENCOUNTER — Telehealth: Payer: Self-pay

## 2021-04-29 NOTE — Telephone Encounter (Signed)
New message  [name] Key: [id number] - PA Case ID: [id number] - Rx #: [id number] Need help? Call us at [phone] Status Sent to [facility] Bernita Raisin 100MG  tablets Form PA Form [phone] NCPDP) Original Claim Info 36 Your information has been submitted and will be reviewed by 73. You may close this dialog, return to your dashboard, and perform other tasks. An electronic determination will be received in CoverMyMeds within 72-120 hours. You can see the latest determination by locating this request on your dashboard or by reopening this request. You will receive a fax copy of the determination. If [place] has not responded in 120 hours, contact [employer] at [phone].

## 2021-05-08 DIAGNOSIS — Z0279 Encounter for issue of other medical certificate: Secondary | ICD-10-CM

## 2021-07-18 ENCOUNTER — Telehealth: Payer: Self-pay

## 2021-07-18 NOTE — Telephone Encounter (Signed)
New message   Ann Frey (Key: [id number]) Emgality 120MG /ML syringes (migraine)   Form [employer] Electronic PA Form (2017 NCPDP) Created 2 days ago Sent to Plan 4 minutes ago Plan Response 4 minutes ago Submit Clinical Questions 1 minute ago Determination Wait for Determination Please wait for [employer] 2017 to return a determination.  [employer] is reviewing your PA request and will respond within 24 hours for Medicaid or up to 72 hours for non-Medicaid plans, based on the required timeframe determined by state or federal regulations. To check for an update later, open this request from your dashboard.

## 2021-07-19 ENCOUNTER — Other Ambulatory Visit: Payer: Self-pay

## 2021-07-19 ENCOUNTER — Encounter: Payer: Self-pay | Admitting: Neurology

## 2021-07-19 ENCOUNTER — Ambulatory Visit: Payer: Managed Care, Other (non HMO) | Admitting: Neurology

## 2021-07-19 VITALS — BP 135/87 | HR 75 | Ht 63.0 in | Wt 217.0 lb

## 2021-07-19 DIAGNOSIS — G43109 Migraine with aura, not intractable, without status migrainosus: Secondary | ICD-10-CM

## 2021-07-19 DIAGNOSIS — G43009 Migraine without aura, not intractable, without status migrainosus: Secondary | ICD-10-CM

## 2021-07-19 NOTE — Progress Notes (Signed)
NEUROLOGY FOLLOW UP OFFICE NOTE  Ann Frey [id number]  Assessment/Plan:   Migraine without aura, without status migrainosus, not intractable Migraine with aura, without status migrainosus, not intractable   1.Migraine prevention:  Since migraines occur last week prior to injection, will have her take Emgality every 21 days 2.Migraine rescue:  Ubrelvy 100mg  3. Limit use of pain relievers to no more than 2 days out of week to prevent risk of rebound or medication-overuse headache. 4. Keep headache diary 5.  Follow up 6 months   Subjective:  Ann Frey is a 36 year old right-handed woman with sinus tachycardia who follows up for migraines.   UPDATE: Intensity:  moderate Duration:  couple of hours Frequency:  2 to 3 a month (usually occurs last week prior to next injection)  Current NSAIDS: none Current analgesics: None Current triptans: none Current ergotamine: None Current anti-emetic: None Current muscle relaxants: None Current anti-anxiolytic: None Current sleep aide: None Current Antihypertensive medications: Cardizem Current Antidepressant medications: None Current Anticonvulsant medications: none Current anti-CGRP: Emgality Ubrelvy 100mg  Current Vitamins/Herbal/Supplements: None Current Antihistamines/Decongestants: None Other therapy: None   Caffeine: No coffee Alcohol: No Smoker: No Diet: Drinks plenty of water Exercise: Walks daily Depression: No; Anxiety: Yes Other pain: None times neck pain Sleep hygiene: Good   HISTORY:  She began having headaches intermittently since 2013.  Location:  Temples/forehead bilateral.  Quality:  pounding.  Initial intensity:  10/10.  Aura:  no.  Prodrome:  no.  Associated symptoms:  Spinning sensation, nausea, phonophobia.  Whitening/fogginess of vision.  No vomiting, diplopia, slurred speech, focal numbness or weakness.  Initial Duration:  3 days.  Initial Frequency:  Twice a year but has daily dull headaches.  Triggers:   Fatigue.  Also chemicals from cleaning fluids.  Relieving factors:  None.  Activity:  Goes to hospital when severe.       Past NSAIDS:  no Past analgesics:  Tylenol Past abortive triptans:  sumatriptan (did not tolerate); rizatriptan (palpitations) Past abortive ergotamine:  no Past muscle relaxants:  no Past anti-emetic:  no Past antihypertensive medications:  Propranolol ER 80mg  (shortness of breath and rash), metoprolol (lightheadedness) Past antidepressant medications:  Nortriptyline 10mg  (chest pain) Past anticonvulsant medications:  topiramate 150mg  (irritability, memory deficits, dry mouth, paresthesias) Past anti-CGRP:  Aimovig (constipation) Past vitamins/Herbal/Supplements:  no Past antihistamines/decongestants:  no Other past therapies:  no   She has history of other somatic symptoms, particularly chest pain, palpitations and shortness of breath, which has been worked up by cardiology and testing negative.   07/28/17.  She developed sudden tingling of the entire upper and lower face which then involved the entire body with burning and redness in the hands.  She had associated generalized weakness, spinning sensation, nausea, shortness of breath and visual disturbance described as blurred vision with dimming of vision.  She denied headache at the time.  Head CT was personally reviewed and was unremarkable.  EKG and labs were unremarkable.  Since then, she continues to feel generalized weakness, dysesthesias of the face and entire body/extremities and lightheadedness.  MRI of brain with and without contrast was performed on 08/19/17, which was personally reviewed and demonstrated nonspecific mild scattered small subcortical white matter foci, likely related to migraine, and incidental developmental venous anomaly  Otherwise, no acute abnormalities.  Topiramate was discontinued but spells persisted.    PAST MEDICAL HISTORY: Past Medical History:  Diagnosis Date   Hypertension     Migraines    No pertinent past medical history  MEDICATIONS: Current Outpatient Medications on File Prior to Visit  Medication Sig Dispense Refill   calcium-vitamin D (OSCAL WITH D) 500-200 MG-UNIT tablet Take 1 tablet by mouth.     esomeprazole (NEXIUM) 40 MG capsule Take 1 capsule (40 mg total) by mouth daily at 12 noon. 30 capsule 0   ferrous sulfate 324 MG TBEC Take 65 mg by mouth daily.     Galcanezumab-gnlm (EMGALITY) 120 MG/ML SOSY INJECT 120 MG INTO THE SKIN EVERY 28 DAYS 1 mL 4   metoprolol succinate (TOPROL-XL) 50 MG 24 hr tablet TAKE 1 TABLET BY MOUTH ONCE DAILY WITH  FOOD  OR  IMMEDIATELY  FOLLOWING  A  MEAL 90 tablet 0   naproxen sodium (ANAPROX) 550 MG tablet TAKE 1 TABLET BY MOUTH EVERY 12 HOURS AS NEEDED 16 tablet 0   UBRELVY 100 MG TABS TAKE 1 TABLET BY MOUTH ONCE DAILY AS NEEDED **MAY  REPEAT  IN  2  HOURS  IF  NEEDED,  MAXIMUM  2  TABLETS  IN  24  HOURS** 16 tablet 5   vitamin B-12 (CYANOCOBALAMIN) 500 MCG tablet Take 500 mcg by mouth daily.     No current facility-administered medications on file prior to visit.    ALLERGIES: Allergies  Allergen Reactions   Propranolol Hcl    Diltiazem Hives   Propranolol Palpitations    FAMILY HISTORY: Family History  Problem Relation Age of Onset   Hypertension Father    Other Father        "Big heart"   Diabetes Mother    Hypertension Mother    Asthma Sister    Other Paternal Grandfather        "Big heart"      Objective:  Blood pressure 135/87, pulse 75, height 5\' 3"  (1.6 m), weight 217 lb (98.4 kg), SpO2 100 %. General: No acute distress.  Patient appears well-groomed.     Ann Clines, DO  CC: Ann Guthrie, MD

## 2021-07-19 NOTE — Patient Instructions (Signed)
Take Emgality injection every 21 days. Take Roselyn Meier as needed/directed Follow up 6 months

## 2021-09-10 ENCOUNTER — Other Ambulatory Visit: Payer: Self-pay | Admitting: Neurology

## 2021-10-17 ENCOUNTER — Telehealth: Payer: Self-pay | Admitting: Neurology

## 2021-10-17 ENCOUNTER — Encounter: Payer: Self-pay | Admitting: Neurology

## 2021-10-17 NOTE — Telephone Encounter (Signed)
Patient is traveling In June, she needs a note stating she is taking [employer].  ?

## 2021-10-18 NOTE — Telephone Encounter (Signed)
LMOVM for pt, Letter ready.  ?

## 2021-10-22 NOTE — Telephone Encounter (Signed)
Pt called in returning [facility] call. She left a message yesterday. ?

## 2021-10-23 NOTE — Telephone Encounter (Signed)
LMOVM letter ready for pick up ?

## 2021-12-05 ENCOUNTER — Other Ambulatory Visit: Payer: Self-pay | Admitting: Neurology

## 2021-12-09 NOTE — Progress Notes (Unsigned)
NEUROLOGY FOLLOW UP OFFICE NOTE  Ann Frey [id number]  Assessment/Plan:   Migraine without aura, without status migrainosus, not intractable Migraine with aura, without status migrainosus, not intractable   1.Migraine prevention:  Since migraines occur last week prior to injection, will have her take Emgality every 21 days 2.Migraine rescue:  Ubrelvy 100mg  3. Limit use of pain relievers to no more than 2 days out of week to prevent risk of rebound or medication-overuse headache. 4. Keep headache diary 5.  Follow up 6 months   Subjective:  Ann Frey is a 36 year old right-handed woman with sinus tachycardia who follows up for migraines.   UPDATE: Increased Emgality to every 21 days. Intensity:  moderate Duration:  couple of hours Frequency:  2 to 3 a month (usually occurs last week prior to next injection)   Current NSAIDS: none Current analgesics: None Current triptans: none Current ergotamine: None Current anti-emetic: None Current muscle relaxants: None Current anti-anxiolytic: None Current sleep aide: None Current Antihypertensive medications: Cardizem Current Antidepressant medications: None Current Anticonvulsant medications: none Current anti-CGRP: Emgality every 21 days,  Ubrelvy 100mg  Current Vitamins/Herbal/Supplements: None Current Antihistamines/Decongestants: None Other therapy: None   Caffeine: No coffee Alcohol: No Smoker: No Diet: Drinks plenty of water Exercise: Walks daily Depression: No; Anxiety: Yes Other pain: None times neck pain Sleep hygiene: Good   HISTORY:  She began having headaches intermittently since 2013.  Location:  Temples/forehead bilateral.  Quality:  pounding.  Initial intensity:  10/10.  Aura:  no.  Prodrome:  no.  Associated symptoms:  Spinning sensation, nausea, phonophobia.  Whitening/fogginess of vision.  No vomiting, diplopia, slurred speech, focal numbness or weakness.  Initial Duration:  3 days.  Initial Frequency:   Twice a year but has daily dull headaches.  Triggers:  Fatigue.  Also chemicals from cleaning fluids.  Relieving factors:  None.  Activity:  Goes to hospital when severe.       Past NSAIDS:  no Past analgesics:  Tylenol Past abortive triptans:  sumatriptan (did not tolerate); rizatriptan (palpitations) Past abortive ergotamine:  no Past muscle relaxants:  no Past anti-emetic:  no Past antihypertensive medications:  Propranolol ER 80mg  (shortness of breath and rash), metoprolol (lightheadedness) Past antidepressant medications:  Nortriptyline 10mg  (chest pain) Past anticonvulsant medications:  topiramate 150mg  (irritability, memory deficits, dry mouth, paresthesias) Past anti-CGRP:  Aimovig (constipation) Past vitamins/Herbal/Supplements:  no Past antihistamines/decongestants:  no Other past therapies:  no   She has history of other somatic symptoms, particularly chest pain, palpitations and shortness of breath, which has been worked up by cardiology and testing negative.   07/28/17.  She developed sudden tingling of the entire upper and lower face which then involved the entire body with burning and redness in the hands.  She had associated generalized weakness, spinning sensation, nausea, shortness of breath and visual disturbance described as blurred vision with dimming of vision.  She denied headache at the time.  Head CT was personally reviewed and was unremarkable.  EKG and labs were unremarkable.  Since then, she continues to feel generalized weakness, dysesthesias of the face and entire body/extremities and lightheadedness.  MRI of brain with and without contrast was performed on 08/19/17, which was personally reviewed and demonstrated nonspecific mild scattered small subcortical white matter foci, likely related to migraine, and incidental developmental venous anomaly  Otherwise, no acute abnormalities.  Topiramate was discontinued but spells persisted.    PAST MEDICAL HISTORY: Past  Medical History:  Diagnosis Date   Hypertension  Migraines    No pertinent past medical history     MEDICATIONS: Current Outpatient Medications on File Prior to Visit  Medication Sig Dispense Refill   calcium-vitamin D (OSCAL WITH D) 500-200 MG-UNIT tablet Take 1 tablet by mouth.     EMGALITY 120 MG/ML SOSY INJECT 120 MG SUBCUTANEOUSLY ONCE EVERY 28 DAYS 1 mL 0   esomeprazole (NEXIUM) 40 MG capsule Take 1 capsule (40 mg total) by mouth daily at 12 noon. 30 capsule 0   ferrous sulfate 324 MG TBEC Take 65 mg by mouth daily.     metoprolol succinate (TOPROL-XL) 50 MG 24 hr tablet TAKE 1 TABLET BY MOUTH ONCE DAILY WITH  FOOD  OR  IMMEDIATELY  FOLLOWING  A  MEAL 90 tablet 0   naproxen sodium (ANAPROX) 550 MG tablet TAKE 1 TABLET BY MOUTH EVERY 12 HOURS AS NEEDED 16 tablet 0   UBRELVY 100 MG TABS TAKE 1 TABLET BY MOUTH ONCE DAILY AS NEEDED **MAY  REPEAT  IN  2  HOURS  IF  NEEDED,  MAXIMUM  2  TABLETS  IN  24  HOURS** 16 tablet 5   vitamin B-12 (CYANOCOBALAMIN) 500 MCG tablet Take 500 mcg by mouth daily.     No current facility-administered medications on file prior to visit.    ALLERGIES: Allergies  Allergen Reactions   Propranolol Hcl    Diltiazem Hives   Propranolol Palpitations    FAMILY HISTORY: Family History  Problem Relation Age of Onset   Hypertension Father    Other Father        "Big heart"   Diabetes Mother    Hypertension Mother    Asthma Sister    Other Paternal Grandfather        "Big heart"      Objective:  *** General: No acute distress.  Patient appears well-groomed.   Head:  Normocephalic/atraumatic Eyes:  Fundi examined but not visualized Neck: supple, no paraspinal tenderness, full range of motion Heart:  Regular rate and rhythm Lungs:  Clear to auscultation bilaterally Back: No paraspinal tenderness Neurological Exam: alert and oriented to person, place, and time.  Speech fluent and not dysarthric, language intact.  CN II-XII intact. Bulk and tone  normal, muscle strength 5/5 throughout.  Sensation to light touch intact.  Deep tendon reflexes 2+ throughout, toes downgoing.  Finger to nose testing intact.  Gait normal, Romberg negative.   Shon Millet, DO  CC: Leodis Sias, MD

## 2021-12-10 ENCOUNTER — Encounter: Payer: Self-pay | Admitting: Neurology

## 2021-12-10 ENCOUNTER — Ambulatory Visit: Payer: Managed Care, Other (non HMO) | Admitting: Neurology

## 2021-12-10 VITALS — BP 129/75 | HR 92 | Ht 63.0 in | Wt 213.2 lb

## 2021-12-10 DIAGNOSIS — G43009 Migraine without aura, not intractable, without status migrainosus: Secondary | ICD-10-CM | POA: Diagnosis not present

## 2022-01-01 ENCOUNTER — Ambulatory Visit: Payer: Managed Care, Other (non HMO) | Admitting: Neurology

## 2022-01-30 ENCOUNTER — Other Ambulatory Visit: Payer: Self-pay | Admitting: Neurology

## 2022-02-18 ENCOUNTER — Telehealth (HOSPITAL_COMMUNITY): Payer: Self-pay | Admitting: Pharmacy Technician

## 2022-02-18 NOTE — Telephone Encounter (Signed)
Patient Advocate Encounter  Prior Authorization for Bernita Raisin 100MG  tablets has been approved.    PA# Key: [id number] Effective dates: 02/18/2022 through 02/18/2023      02/20/2023, CPhT Pharmacy Patient Advocate Specialist [facility] Patient Advocate Team Direct Number: [phone]  Fax: [phone]

## 2022-02-19 ENCOUNTER — Other Ambulatory Visit: Payer: Self-pay | Admitting: Neurology

## 2022-06-08 ENCOUNTER — Other Ambulatory Visit: Payer: Self-pay | Admitting: Neurology

## 2022-06-09 NOTE — Progress Notes (Unsigned)
NEUROLOGY FOLLOW UP OFFICE NOTE  Ann Frey [id number]  Assessment/Plan:   Migraine without aura, without status migrainosus, not intractable Migraine with aura, without status migrainosus, not intractable   1.Migraine prevention:   Emgality every 21 days 2.Migraine rescue:  Ubrelvy 100mg .  May take Tylenol if needed. 3. Limit use of pain relievers to no more than 2 days out of week to prevent risk of rebound or medication-overuse headache. 4. Keep headache diary 5.  Follow up 9 months.   Subjective:  Ann Frey is a 36 year old right-handed woman with sinus tachycardia who follows up for migraines.   UPDATE: Increased Emgality to every 21 days.  Improved. Intensity:  moderate Duration:  couple of hours Frequency:  1 migraine or less a month However, last week, she had a daily migraine every day for a week.  Needed to take Friday off.     Current NSAIDS: none Current analgesics: None Current triptans: none Current ergotamine: None Current anti-emetic: None Current muscle relaxants: None Current anti-anxiolytic: None Current sleep aide: None Current Antihypertensive medications: Cardizem Current Antidepressant medications: None Current Anticonvulsant medications: none Current anti-CGRP: Emgality every 21 days,  Ubrelvy 100mg  Current Vitamins/Herbal/Supplements: None Current Antihistamines/Decongestants: None Other therapy: None   Caffeine: No coffee Alcohol: No Smoker: No Diet: Drinks plenty of water Exercise: Walks daily Depression: No; Anxiety: Yes Other pain: None times neck pain Sleep hygiene: Good   HISTORY:  She began having headaches intermittently since 2013.  Location:  Temples/forehead bilateral.  Quality:  pounding.  Initial intensity:  10/10.  Aura:  no.  Prodrome:  no.  Associated symptoms:  Spinning sensation, nausea, phonophobia.  Whitening/fogginess of vision.  No vomiting, diplopia, slurred speech, focal numbness or weakness.  Initial Duration:   3 days.  Initial Frequency:  Twice a year but has daily dull headaches.  Triggers:  Fatigue.  Also chemicals from cleaning fluids.  Relieving factors:  None.  Activity:  Goes to hospital when severe.       Past NSAIDS:  no Past analgesics:  Tylenol Past abortive triptans:  sumatriptan (did not tolerate); rizatriptan (palpitations) Past abortive ergotamine:  no Past muscle relaxants:  no Past anti-emetic:  no Past antihypertensive medications:  Propranolol ER 80mg  (shortness of breath and rash), metoprolol (lightheadedness) Past antidepressant medications:  Nortriptyline 10mg  (chest pain) Past anticonvulsant medications:  topiramate 150mg  (irritability, memory deficits, dry mouth, paresthesias) Past anti-CGRP:  Aimovig (constipation) Past vitamins/Herbal/Supplements:  no Past antihistamines/decongestants:  no Other past therapies:  no   She has history of other somatic symptoms, particularly chest pain, palpitations and shortness of breath, which has been worked up by cardiology and testing negative.   07/28/17.  She developed sudden tingling of the entire upper and lower face which then involved the entire body with burning and redness in the hands.  She had associated generalized weakness, spinning sensation, nausea, shortness of breath and visual disturbance described as blurred vision with dimming of vision.  She denied headache at the time.  Head CT was personally reviewed and was unremarkable.  EKG and labs were unremarkable.  Since then, she continues to feel generalized weakness, dysesthesias of the face and entire body/extremities and lightheadedness.  MRI of brain with and without contrast was performed on 08/19/17, which was personally reviewed and demonstrated nonspecific mild scattered small subcortical white matter foci, likely related to migraine, and incidental developmental venous anomaly  Otherwise, no acute abnormalities.  Topiramate was discontinued but spells persisted.    PAST  MEDICAL HISTORY:  Past Medical History:  Diagnosis Date   Hypertension    Migraines    No pertinent past medical history     MEDICATIONS: Current Outpatient Medications on File Prior to Visit  Medication Sig Dispense Refill   calcium-vitamin D (OSCAL WITH D) 500-200 MG-UNIT tablet Take 1 tablet by mouth.     EMGALITY 120 MG/ML SOSY INJECT 120 MG SUBCUTANEOUSLY ONCE EVERY 28 DAYS 1 mL 3   esomeprazole (NEXIUM) 40 MG capsule Take 1 capsule (40 mg total) by mouth daily at 12 noon. 30 capsule 0   ferrous sulfate 324 MG TBEC Take 65 mg by mouth daily.     metoprolol succinate (TOPROL-XL) 50 MG 24 hr tablet TAKE 1 TABLET BY MOUTH ONCE DAILY WITH  FOOD  OR  IMMEDIATELY  FOLLOWING  A  MEAL 90 tablet 0   naproxen sodium (ANAPROX) 550 MG tablet TAKE 1 TABLET BY MOUTH EVERY 12 HOURS AS NEEDED 16 tablet 0   vitamin B-12 (CYANOCOBALAMIN) 500 MCG tablet Take 500 mcg by mouth daily.     No current facility-administered medications on file prior to visit.    ALLERGIES: Allergies  Allergen Reactions   Propranolol Hcl    Diltiazem Hives   Propranolol Palpitations    FAMILY HISTORY: Family History  Problem Relation Age of Onset   Hypertension Father    Other Father        "Big heart"   Diabetes Mother    Hypertension Mother    Asthma Sister    Other Paternal Grandfather        "Big heart"      Objective:  Blood pressure (!) 142/89, pulse 80, height 5\' 3"  (1.6 m), weight 213 lb (96.6 kg), SpO2 91 %. General: No acute distress.  Patient appears well-groomed.   Head:  Normocephalic/atraumatic Eyes:  Fundi examined but not visualized Neck: supple, no paraspinal tenderness, full range of motion Heart:  Regular rate and rhythm Lungs:  Clear to auscultation bilaterally Back: No paraspinal tenderness Neurological Exam: alert and oriented to person, place, and time.  Speech fluent and not dysarthric, language intact.  CN II-XII intact. Bulk and tone normal, muscle strength 5/5 throughout.   Sensation to light touch intact.  Deep tendon reflexes 2+ throughout.  Finger to nose testing intact.  Gait normal, Romberg negative.   , DO  CC: Shon Millet, MD

## 2022-06-11 ENCOUNTER — Ambulatory Visit: Payer: Managed Care, Other (non HMO) | Admitting: Neurology

## 2022-06-11 ENCOUNTER — Encounter: Payer: Self-pay | Admitting: Neurology

## 2022-06-11 VITALS — BP 142/89 | HR 80 | Ht 63.0 in | Wt 213.0 lb

## 2022-06-11 DIAGNOSIS — G43009 Migraine without aura, not intractable, without status migrainosus: Secondary | ICD-10-CM

## 2022-06-11 DIAGNOSIS — Z0279 Encounter for issue of other medical certificate: Secondary | ICD-10-CM

## 2022-06-11 DIAGNOSIS — G43109 Migraine with aura, not intractable, without status migrainosus: Secondary | ICD-10-CM

## 2022-06-11 MED ORDER — EMGALITY 120 MG/ML ~~LOC~~ SOSY: PREFILLED_SYRINGE | SUBCUTANEOUS | 11 refills | Status: DC

## 2022-06-11 MED ORDER — UBRELVY 100 MG PO TABS: 1.0000 | ORAL_TABLET | ORAL | 11 refills | Status: DC | PRN

## 2022-06-11 NOTE — Patient Instructions (Signed)
Emgality every 21 days. Bernita Raisin as instructed

## 2022-06-12 NOTE — Progress Notes (Signed)
FMLA Paperwork received today.

## 2022-07-17 ENCOUNTER — Other Ambulatory Visit (HOSPITAL_COMMUNITY): Payer: Self-pay

## 2022-07-17 ENCOUNTER — Telehealth: Payer: Self-pay

## 2022-07-17 NOTE — Telephone Encounter (Signed)
Pharmacy Patient Advocate Encounter   Received notification from [employer] that prior authorization for Emgality 120MG /ML syringes (migraine) is required/requested.    PA submitted on 07/17/2022 to (ins) [employer] via CoverMyMeds Key [id number] Status is pending

## 2022-07-18 ENCOUNTER — Other Ambulatory Visit (HOSPITAL_COMMUNITY): Payer: Self-pay

## 2022-07-18 NOTE — Telephone Encounter (Signed)
Pharmacy Patient Advocate Encounter  Prior Authorization for Emgality 120MG /ML syringes (migraine) has been approved.    PA# [id number] Effective dates: 07/17/2022 through 07/27/2023  Per test billing refill too soon-can be filled on 07/21/2022.

## 2022-10-23 ENCOUNTER — Other Ambulatory Visit: Payer: Self-pay | Admitting: Nurse Practitioner

## 2022-10-23 ENCOUNTER — Other Ambulatory Visit (HOSPITAL_COMMUNITY)
Admission: RE | Admit: 2022-10-23 | Discharge: 2022-10-23 | Disposition: A | Discharge status: Home / Self Care | Payer: Managed Care, Other (non HMO) | Source: Ambulatory Visit | Attending: Nurse Practitioner | Admitting: Nurse Practitioner

## 2022-10-23 DIAGNOSIS — Z124 Encounter for screening for malignant neoplasm of cervix: Secondary | ICD-10-CM | POA: Diagnosis present

## 2022-10-27 LAB — CYTOLOGY - PAP
Comment: NEGATIVE
Diagnosis: NEGATIVE
High risk HPV: NEGATIVE

## 2022-12-22 ENCOUNTER — Emergency Department (HOSPITAL_COMMUNITY): Payer: Managed Care, Other (non HMO)

## 2022-12-22 ENCOUNTER — Encounter (HOSPITAL_COMMUNITY): Payer: Self-pay | Admitting: *Deleted

## 2022-12-22 ENCOUNTER — Other Ambulatory Visit: Payer: Self-pay

## 2022-12-22 ENCOUNTER — Emergency Department (HOSPITAL_COMMUNITY)
Admission: EM | Admit: 2022-12-22 | Discharge: 2022-12-22 | Disposition: A | Discharge status: Home / Self Care | Payer: Managed Care, Other (non HMO) | Attending: Emergency Medicine | Admitting: Emergency Medicine

## 2022-12-22 DIAGNOSIS — I10 Essential (primary) hypertension: Secondary | ICD-10-CM | POA: Insufficient documentation

## 2022-12-22 DIAGNOSIS — R079 Chest pain, unspecified: Secondary | ICD-10-CM | POA: Diagnosis not present

## 2022-12-22 DIAGNOSIS — R519 Headache, unspecified: Secondary | ICD-10-CM | POA: Insufficient documentation

## 2022-12-22 LAB — BASIC METABOLIC PANEL
Anion gap: 13 (ref 5–15)
BUN: 6 mg/dL (ref 6–20)
CO2: 22 mmol/L (ref 22–32)
Calcium: 9.8 mg/dL (ref 8.9–10.3)
Chloride: 105 mmol/L (ref 98–111)
Creatinine, Ser: 0.88 mg/dL (ref 0.44–1.00)
GFR, Estimated: >60 mL/min (ref >60)
Glucose, Bld: 92 mg/dL (ref 70–99)
Potassium: 3.8 mmol/L (ref 3.5–5.1)
Sodium: 140 mmol/L (ref 135–145)

## 2022-12-22 LAB — CBC
HCT: 39 % (ref 36.0–46.0)
Hemoglobin: 12.8 g/dL (ref 12.0–15.0)
MCH: 26.1 pg (ref 26.0–34.0)
MCHC: 32.8 g/dL (ref 30.0–36.0)
MCV: 79.6 fL — ABNORMAL LOW (ref 80.0–100.0)
Platelets: 282 10*3/uL (ref 150–400)
RBC: 4.9 MIL/uL (ref 3.87–5.11)
RDW: 13.4 % (ref 11.5–15.5)
WBC: 5.2 10*3/uL (ref 4.0–10.5)
nRBC: 0 % (ref 0.0–0.2)

## 2022-12-22 LAB — TROPONIN I (HIGH SENSITIVITY): Troponin I (High Sensitivity): 4 ng/L (ref <18)

## 2022-12-22 LAB — I-STAT BETA HCG BLOOD, ED (MC, WL, AP ONLY): I-stat hCG, quantitative: <5 m[IU]/mL (ref <5)

## 2022-12-22 MED ORDER — DIPHENHYDRAMINE HCL 50 MG/ML IJ SOLN
25.0000 mg | Freq: Once | INTRAMUSCULAR | Status: AC
  Administered 2022-12-22: 25 mg via INTRAVENOUS
  Filled 2022-12-22: qty 1

## 2022-12-22 MED ORDER — METOCLOPRAMIDE HCL 5 MG/ML IJ SOLN
10.0000 mg | Freq: Once | INTRAMUSCULAR | Status: AC
  Administered 2022-12-22: 10 mg via INTRAVENOUS
  Filled 2022-12-22: qty 2

## 2022-12-22 MED ORDER — SODIUM CHLORIDE 0.9 % IV BOLUS
500.0000 mL | Freq: Once | INTRAVENOUS | Status: AC
  Administered 2022-12-22: 500 mL via INTRAVENOUS

## 2022-12-22 MED ORDER — KETOROLAC TROMETHAMINE 15 MG/ML IJ SOLN
15.0000 mg | Freq: Once | INTRAMUSCULAR | Status: AC
  Administered 2022-12-22: 15 mg via INTRAVENOUS
  Filled 2022-12-22: qty 1

## 2022-12-22 NOTE — Discharge Instructions (Addendum)
Please take your medications as prescribed. Take tylenol/ibuprofen for pain. I recommend close follow-up with PCP for reevaluation.  Please do not hesitate to return to emergency department if worrisome signs symptoms we discussed become apparent.  

## 2022-12-22 NOTE — ED Triage Notes (Signed)
C/o weakness and pounding in her chest onset last night states it comes and goes

## 2022-12-22 NOTE — ED Notes (Signed)
Patient transported to CT 

## 2022-12-22 NOTE — ED Provider Notes (Signed)
[facility] AT [facility] Provider Note   CSN: [id number] Arrival date & time: 12/22/22  4098     History  Chief Complaint  Patient presents with   Weakness    Ann Frey is a 37 y.o. female tree of migraines, hypertension presents today for evaluation of headache and chest pain.  States she started to have chest pain yesterday.  The pain is located in the center of her chest, intermittent, tight, nonradiating.  She also complains of headache.  She described it as pulsating, locating in the left side of her head and she has a few seconds of blackout vision.  Denies fever, nausea, vomiting, cough, runny nose, shortness of breath, bowel change, urinary symptoms.  States she feels generally weak.  History of migraine and she has been using Emgality injection at home.   Weakness     Past Medical History:  Diagnosis Date   Hypertension    Migraines    No pertinent past medical history    Past Surgical History:  Procedure Laterality Date   CESAREAN SECTION     CESAREAN SECTION  05/25/2011   Procedure: CESAREAN SECTION;  Surgeon: Lesly Dukes, MD;  Location: [facility];  Service: Gynecology;  Laterality: N/A;  Repeat cesarean section with delivery of baby boy at 83. Apgars 6/9.     Home Medications Prior to Admission medications   Medication Sig Start Date End Date Taking? Authorizing Provider  esomeprazole (NEXIUM) 40 MG capsule Take 1 capsule (40 mg total) by mouth daily at 12 noon. 02/09/21   Charlynne Pander, MD  ferrous sulfate 324 MG TBEC Take 65 mg by mouth daily.    [provider]  Galcanezumab-gnlm (EMGALITY) 120 MG/ML SOSY INJECT 120 MG SUBCUTANEOUSLY ONCE EVERY 28 DAYS 06/11/22   Everlena Cooper, Adam R, DO  KAPSPARGO SPRINKLE 50 MG CS24 Take 1 capsule by mouth daily. 06/10/22   [provider]  naproxen sodium (ANAPROX) 550 MG tablet TAKE 1 TABLET BY MOUTH EVERY 12 HOURS AS NEEDED 09/08/19   Everlena Cooper, Adam R, DO  Ubrogepant  (UBRELVY) 100 MG TABS Take 1 tablet by mouth as needed (May repeat after 2 hours. Maximum 2 tablets in 24 hours.). 06/11/22   Drema Dallas, DO  vitamin B-12 (CYANOCOBALAMIN) 500 MCG tablet Take 500 mcg by mouth daily.    [provider]      Allergies    Propranolol hcl, Diltiazem, and Propranolol    Review of Systems   Review of Systems  Neurological:  Positive for weakness.    Physical Exam Updated Vital Signs BP 125/83   Pulse 77   Temp 98.5 F (36.9 C) (Oral)   Resp 14   Ht 5\' 3"  (1.6 m)   Wt 93 kg   SpO2 100%   BMI 36.31 kg/m  Physical Exam Vitals and nursing note reviewed.  Constitutional:      Appearance: Normal appearance.  HENT:     Head: Normocephalic and atraumatic.     Mouth/Throat:     Mouth: Mucous membranes are moist.  Eyes:     General: No scleral icterus. Cardiovascular:     Rate and Rhythm: Normal rate and regular rhythm.     Pulses: Normal pulses.     Heart sounds: Normal heart sounds.  Pulmonary:     Effort: Pulmonary effort is normal.     Breath sounds: Normal breath sounds.  Abdominal:     General: Abdomen is flat.  Palpations: Abdomen is soft.     Tenderness: There is no abdominal tenderness.  Musculoskeletal:        General: No deformity.  Skin:    General: Skin is warm.     Findings: No rash.  Neurological:     General: No focal deficit present.     Mental Status: She is alert.     Comments: Cranial nerves II through XII intact. Intact sensation to light touch in all 4 extremities. 5/5 strength in all 4 extremities. Intact finger-to-nose and heel-to-shin of all 4 extremities. No visual field cuts. No neglect noted. No aphasia noted.   Psychiatric:        Mood and Affect: Mood normal.     ED Results / Procedures / Treatments   Labs (all labs ordered are listed, but only abnormal results are displayed) Labs Reviewed - No data to display  EKG None  Radiology No results found.  Procedures Procedures     Medications Ordered in ED Medications - No data to display  ED Course/ Medical Decision Making/ A&P                             Medical Decision Making Amount and/or Complexity of Data Reviewed Labs: ordered. Radiology: ordered.  Risk Prescription drug management.   This patient presents to the ED for chest pain, this involves an extensive number of treatment options, and is a complaint that carries with a high risk of complications and morbidity.  The differential diagnosis includes ACS, pericarditis, PE, pneumothorax, pneumonia, less likely dissection with essentially normal blood pressure, symmetric bilateral pulses, and no back pain.  This is not an exhaustive list.  Lab tests: I ordered and personally interpreted labs.  The pertinent results include: WBC unremarkable. Hbg unremarkable. Platelets unremarkable. Electrolytes unremarkable. BUN, creatinine unremarkable. Troponin negative.  Imaging studies: I ordered imaging studies. I personally reviewed, interpreted imaging and agree with the radiologist's interpretations. The results include: Ct head showed no acute intracranial abnormalities.   Problem list/ ED course/ Critical interventions/ Medical management: HPI: See above Vital signs within normal range and stable throughout visit. Laboratory/imaging studies significant for: See above. On physical examination, patient is afebrile and appears in no acute distress. Exam without evidence of volume overload so doubt heart failure. Unlikely ACS/MI, EKG without acute ischemic changes, troponin negative, denies active chest pain after administering medication. Unlikely PE, patient is PERC negative. Based on chest XR I have low suspicion for pneumonia, pneumothorax. Unlike pericarditis, patient has no recent illness, troponin is negative. She also complains of headache. Neuro exam without focal neurological deficits. CT head showed progressive mild to moderate white matter changes,  no acute intracranial abnormalities. Symptom most consistent with tension type headache. NO headache red flags.  No history of trauma, CT head negative, doubt ICH, tumor or mass effect.  Patient was controlled headache cocktail.  Heart score of 1 so plans to discharge patient home with cardiology follow-up. I have reviewed the patient home medicines and have made adjustments as needed.  Cardiac monitoring/EKG: The patient was maintained on a cardiac monitor.  I personally reviewed and interpreted the cardiac monitor which showed an underlying rhythm of: sinus rhythm.  Additional history obtained: External records from outside source obtained and reviewed including: Chart review including previous notes, labs, imaging.  Consultations obtained:  Disposition Continued outpatient therapy. Follow-up with cardiology and PCP recommended for reevaluation of symptoms. Treatment plan discussed with patient.  Pt  acknowledged understanding was agreeable to the plan. Worrisome signs and symptoms were discussed with patient, and patient acknowledged understanding to return to the ED if they noticed these signs and symptoms. Patient was stable upon discharge.   This chart was dictated using voice recognition software.  Despite best efforts to proofread,  errors can occur which can change the documentation meaning.          Final Clinical Impression(s) / ED Diagnoses Final diagnoses:  Acute nonintractable headache, unspecified headache type  Chest pain, unspecified type    Rx / DC Orders ED Discharge Orders     None         Jeanelle Malling, PA 12/24/22 7829    Arby Barrette, MD 12/29/22 [phone]

## 2022-12-24 NOTE — Progress Notes (Unsigned)
NEUROLOGY FOLLOW UP OFFICE NOTE  [facility] [id number]  Assessment/Plan:   Migraine without aura, without status migrainosus, not intractable Migraine with aura, without status migrainosus, not intractable   1.Migraine prevention:   Emgality every 21 days 2.Migraine rescue:  Ubrelvy 100mg .  May take Tylenol if needed. 3. Limit use of pain relievers to no more than 2 days out of week to prevent risk of rebound or medication-overuse headache. 4. Keep headache diary 5.  Follow up 9 months.   Subjective:  Ayline Fabrizio is a 37 year old right-handed woman with sinus tachycardia who follows up for migraines.   UPDATE: Increased Emgality to every 21 days.  Improved. Intensity:  moderate Duration:  couple of hours Frequency:  1 migraine or less a month  Seen in the ED on 6/17 for headache and chest pain.  CT head personally reviewed negative.  Testing negative for ACS.     Current NSAIDS: none Current analgesics: None Current triptans: none Current ergotamine: None Current anti-emetic: None Current muscle relaxants: None Current anti-anxiolytic: None Current sleep aide: None Current Antihypertensive medications: Cardizem Current Antidepressant medications: None Current Anticonvulsant medications: none Current anti-CGRP: Emgality every 21 days,  Ubrelvy 100mg  Current Vitamins/Herbal/Supplements: None Current Antihistamines/Decongestants: None Other therapy: None   Caffeine: No coffee Alcohol: No Smoker: No Diet: Drinks plenty of water Exercise: Walks daily Depression: No; Anxiety: Yes Other pain: None times neck pain Sleep hygiene: Good   HISTORY:  She began having headaches intermittently since 2013.  Location:  Temples/forehead bilateral.  Quality:  pounding.  Initial intensity:  10/10.  Aura:  no.  Prodrome:  no.  Associated symptoms:  Spinning sensation, nausea, phonophobia.  Whitening/fogginess of vision.  No vomiting, diplopia, slurred speech, focal numbness or  weakness.  Initial Duration:  3 days.  Initial Frequency:  Twice a year but has daily dull headaches.  Triggers:  Fatigue.  Also chemicals from cleaning fluids.  Relieving factors:  None.  Activity:  Goes to hospital when severe.       Past NSAIDS:  no Past analgesics:  Tylenol Past abortive triptans:  sumatriptan (did not tolerate); rizatriptan (palpitations) Past abortive ergotamine:  no Past muscle relaxants:  no Past anti-emetic:  no Past antihypertensive medications:  Propranolol ER 80mg  (shortness of breath and rash), metoprolol (lightheadedness) Past antidepressant medications:  Nortriptyline 10mg  (chest pain) Past anticonvulsant medications:  topiramate 150mg  (irritability, memory deficits, dry mouth, paresthesias) Past anti-CGRP:  Aimovig (constipation) Past vitamins/Herbal/Supplements:  no Past antihistamines/decongestants:  no Other past therapies:  no   She has history of other somatic symptoms, particularly chest pain, palpitations and shortness of breath, which has been worked up by cardiology and testing negative.   07/28/17.  She developed sudden tingling of the entire upper and lower face which then involved the entire body with burning and redness in the hands.  She had associated generalized weakness, spinning sensation, nausea, shortness of breath and visual disturbance described as blurred vision with dimming of vision.  She denied headache at the time.  Head CT was personally reviewed and was unremarkable.  EKG and labs were unremarkable.  Since then, she continues to feel generalized weakness, dysesthesias of the face and entire body/extremities and lightheadedness.  MRI of brain with and without contrast was performed on 08/19/17, which was personally reviewed and demonstrated nonspecific mild scattered small subcortical white matter foci, likely related to migraine, and incidental developmental venous anomaly  Otherwise, no acute abnormalities.  Topiramate was discontinued  but spells persisted.  PAST MEDICAL HISTORY: Past Medical History:  Diagnosis Date   Hypertension    Migraines    No pertinent past medical history     MEDICATIONS: Current Outpatient Medications on File Prior to Visit  Medication Sig Dispense Refill   esomeprazole (NEXIUM) 40 MG capsule Take 1 capsule (40 mg total) by mouth daily at 12 noon. 30 capsule 0   ferrous sulfate 324 MG TBEC Take 65 mg by mouth daily.     Galcanezumab-gnlm (EMGALITY) 120 MG/ML SOSY INJECT 120 MG SUBCUTANEOUSLY ONCE EVERY 28 DAYS 1 mL 11   KAPSPARGO SPRINKLE 50 MG CS24 Take 1 capsule by mouth daily.     naproxen sodium (ANAPROX) 550 MG tablet TAKE 1 TABLET BY MOUTH EVERY 12 HOURS AS NEEDED 16 tablet 0   Ubrogepant (UBRELVY) 100 MG TABS Take 1 tablet by mouth as needed (May repeat after 2 hours. Maximum 2 tablets in 24 hours.). 10 tablet 11   vitamin B-12 (CYANOCOBALAMIN) 500 MCG tablet Take 500 mcg by mouth daily.     No current facility-administered medications on file prior to visit.    ALLERGIES: Allergies  Allergen Reactions   Propranolol Hcl    Diltiazem Hives   Propranolol Palpitations    FAMILY HISTORY: Family History  Problem Relation Age of Onset   Hypertension Father    Other Father        "Big heart"   Diabetes Mother    Hypertension Mother    Asthma Sister    Other Paternal Grandfather        "Big heart"      Objective:  *** General: No acute distress.  Patient appears well-groomed.   Head:  Normocephalic/atraumatic Eyes:  Fundi examined but not visualized Neck: supple, no paraspinal tenderness, full range of motion Heart:  Regular rate and rhythm Neurological Exam: ***   Shon Millet, DO  CC: Leodis Sias, MD

## 2022-12-25 ENCOUNTER — Telehealth: Payer: Self-pay | Admitting: Neurology

## 2022-12-25 ENCOUNTER — Ambulatory Visit: Payer: Managed Care, Other (non HMO) | Admitting: Neurology

## 2022-12-25 ENCOUNTER — Encounter: Payer: Self-pay | Admitting: Neurology

## 2022-12-25 VITALS — BP 150/70 | HR 92 | Ht 63.0 in | Wt 206.0 lb

## 2022-12-25 DIAGNOSIS — G43001 Migraine without aura, not intractable, with status migrainosus: Secondary | ICD-10-CM

## 2022-12-25 DIAGNOSIS — I10 Essential (primary) hypertension: Secondary | ICD-10-CM

## 2022-12-25 MED ORDER — PREDNISONE 10 MG PO TABS: ORAL_TABLET | ORAL | 0 refills | Status: DC

## 2022-12-25 NOTE — Telephone Encounter (Signed)
Called Patient and informed her that per Dr. Everlena Cooper it is ok to take prednisone and Ubrevly together. Patient verbalized understanding and had no further questions or concerns.

## 2022-12-25 NOTE — Patient Instructions (Addendum)
Take 6tabs x1day, then 5tabs x1day, then 4tabs x1day, then 3tabs x1day, then 2tabs x1day, then 1tab x1day, then STOP Take Ubrelvy earliest onset of migraine.  If no improvement in 2 hours, take Reyvow (1 pill in 24 hours).  Advise no driving for 8 hours after use.  Let me know if it works or not. Follow up with PCP regarding depression and anxiety Follow up in 3 months.

## 2022-12-25 NOTE — Telephone Encounter (Signed)
Pt has some questions about medication she states that she was put on prednisone and she needs to know if she is to take the [place] as well

## 2023-01-26 ENCOUNTER — Telehealth: Payer: Self-pay | Admitting: Pharmacy Technician

## 2023-01-26 ENCOUNTER — Other Ambulatory Visit (HOSPITAL_COMMUNITY): Payer: Self-pay

## 2023-01-26 NOTE — Telephone Encounter (Signed)
Pharmacy Patient Advocate Encounter  Received notification from [employer] that Prior Authorization for UBRELVY 100MG  has been APPROVED from 7.22.24 to 7.22.25.Ann Kitchen  PA #/Case ID/Reference #: [id number]   Unable to test bill. Already filled at pharmacy level on 7.15.24.

## 2023-01-26 NOTE — Telephone Encounter (Signed)
Pharmacy Patient Advocate Encounter   Received notification from [employer] that prior authorization for UBRELVY 100MG  is required/requested.   Insurance verification completed.   The patient is insured through [employer] .   Per test claim: PA submitted to [employer] via [employer] Key/confirmation #/EOC UBRELVY 100MG  Status is pending

## 2023-03-13 ENCOUNTER — Ambulatory Visit: Payer: Managed Care, Other (non HMO) | Admitting: Neurology

## 2023-03-23 NOTE — Progress Notes (Unsigned)
Virtual Visit via Video Note  Consent was obtained for video visit:  Yes.   Answered questions that patient had about telehealth interaction:  Yes.   I discussed the limitations, risks, security and privacy concerns of performing an evaluation and management service by telemedicine. I also discussed with the patient that there may be a patient responsible charge related to this service. The patient expressed understanding and agreed to proceed.  Pt location: Home Physician Location: office Name of referring provider:  No ref. provider found I connected with Ann Frey at patients initiation/request on 03/25/2023 at  9:50 AM EDT by video enabled telemedicine application and verified that I am speaking with the correct person using two identifiers. Pt MRN:  [id number] Pt DOB:  July 28, 1985 Video Participants:  Ann Frey    Assessment/Plan:   Migraine without aura, with status migrainosus, intractable - aggravated by increased psychosocial stressors Migraine with aura, without status migrainosus, not intractable    1.Migraine prevention:   Emgality every 21 days 2.Migraine rescue:  Ubrelvy 100mg  - effective now, but still has sample of Reyvow if needed. 3. Limit use of pain relievers to no more than 2 days out of week to prevent risk of rebound or medication-overuse headache. 4. Keep headache diary 5. Follow up 6 to 7 months.   Subjective:  Ann Frey is a 37 year old right-handed woman with sinus tachycardia who follows up for migraines.  She is accompanied by her husband.  ED note personally reviewed.   UPDATE: Prescribed prednisone taper in June, which helped the intractable migraine.  She followed up with her PCP and started on escitalopram for anxiety which has helped with the migraines  She tried Reyvow samples which helped but caused drowsiness.  Now migraines are preceded by ear pain.  She takes [place] and migraine aborts.    Usually migraines have been well. Intensity:   moderate Duration:  couple of hours Frequency:  1 to 4 migraines a month.    Current NSAIDS: none Current analgesics: None Current triptans: none Current ergotamine: None Current anti-emetic: None Current muscle relaxants: None Current anti-anxiolytic: None Current sleep aide: None Current Antihypertensive medications: Cardizem Current Antidepressant medications: escitalopram 10mg  daily Current Anticonvulsant medications: none Current anti-CGRP: Emgality every 21 days,  Ubrelvy 100mg  Current Vitamins/Herbal/Supplements: None Current Antihistamines/Decongestants: None Other therapy: Reyvow (samples)    Caffeine: No coffee Alcohol: No Smoker: No Diet: Drinks plenty of water Exercise: Walks daily Depression: No; Anxiety: Yes Other pain: None times neck pain Sleep hygiene: Good   HISTORY:  She began having headaches intermittently since 2013.  Location:  Temples/forehead bilateral.  Sometimes preceded by ear ache.  Quality:  pounding.  Initial intensity:  10/10.  Aura:  no.  Prodrome:  no.  Associated symptoms:  Spinning sensation, nausea, phonophobia.  Whitening/fogginess of vision.  No vomiting, diplopia, slurred speech, focal numbness or weakness.  Initial Duration:  3 days.  Initial Frequency:  Twice a year but has daily dull headaches.  Triggers:  Fatigue.  Also chemicals from cleaning fluids.  Relieving factors:  None.  Activity:  Goes to hospital when severe.       Past NSAIDS:  naproxen Past analgesics:  Tylenol Past abortive triptans:  sumatriptan (did not tolerate); rizatriptan (palpitations) Past abortive ergotamine:  no Past muscle relaxants:  no Past anti-emetic:  no Past antihypertensive medications:  Propranolol ER 80mg  (shortness of breath and rash), metoprolol (lightheadedness) Past antidepressant medications:  Nortriptyline 10mg  (chest pain) Past anticonvulsant medications:  topiramate 150mg  (irritability,  memory deficits, dry mouth, paresthesias) Past  anti-CGRP:  Aimovig (constipation) Past vitamins/Herbal/Supplements:  no Past antihistamines/decongestants:  no Other past therapies:  no   She has history of other somatic symptoms, particularly chest pain, palpitations and shortness of breath, which has been worked up by cardiology and testing negative.   07/28/17.  She developed sudden tingling of the entire upper and lower face which then involved the entire body with burning and redness in the hands.  She had associated generalized weakness, spinning sensation, nausea, shortness of breath and visual disturbance described as blurred vision with dimming of vision.  She denied headache at the time.  Head CT was personally reviewed and was unremarkable.  EKG and labs were unremarkable.  Since then, she continues to feel generalized weakness, dysesthesias of the face and entire body/extremities and lightheadedness.  MRI of brain with and without contrast was performed on 08/19/17, which was personally reviewed and demonstrated nonspecific mild scattered small subcortical white matter foci, likely related to migraine, and incidental developmental venous anomaly  Otherwise, no acute abnormalities.  Topiramate was discontinued but spells persisted.  Past Medical History: Past Medical History:  Diagnosis Date   Hypertension    Migraines    No pertinent past medical history     Medications: Outpatient Encounter Medications as of 03/25/2023  Medication Sig   esomeprazole (NEXIUM) 40 MG capsule Take 1 capsule (40 mg total) by mouth daily at 12 noon.   ferrous sulfate 324 MG TBEC Take 65 mg by mouth daily.   Galcanezumab-gnlm (EMGALITY) 120 MG/ML SOSY INJECT 120 MG SUBCUTANEOUSLY ONCE EVERY 28 DAYS   KAPSPARGO SPRINKLE 50 MG CS24 Take 1 capsule by mouth daily.   Ubrogepant (UBRELVY) 100 MG TABS Take 1 tablet by mouth as needed (May repeat after 2 hours. Maximum 2 tablets in 24 hours.).   vitamin B-12 (CYANOCOBALAMIN) 500 MCG tablet Take 500 mcg by  mouth daily.   [DISCONTINUED] predniSONE (DELTASONE) 10 MG tablet Take 60mg  on day 1, then 50mg  on day 2, then 40mg  on day 3, then 30mg  on day 4, then 20mg  on day 5, then 10mg  on day 6, then STOP   Multiple Vitamin (MULTI VITAMIN) TABS 1 tablet Orally Once a day   No facility-administered encounter medications on file as of 03/25/2023.    Allergies: Allergies  Allergen Reactions   Propranolol Hcl    Diltiazem Hives   Propranolol Palpitations    Family History: Family History  Problem Relation Age of Onset   Hypertension Father    Other Father        "Big heart"   Diabetes Mother    Hypertension Mother    Asthma Sister    Other Paternal Grandfather        "Big heart"    Observations/Objective:   No acute distress.  Alert and oriented.  Speech fluent and not dysarthric.  Language intact.    Follow Up Instructions:    -I discussed the assessment and treatment plan with the patient. The patient was provided an opportunity to ask questions and all were answered. The patient agreed with the plan and demonstrated an understanding of the instructions.   The patient was advised to call back or seek an in-person evaluation if the symptoms worsen or if the condition fails to improve as anticipated.   Cira Servant, DO    CC: Leodis Sias, MD

## 2023-03-25 ENCOUNTER — Telehealth (INDEPENDENT_AMBULATORY_CARE_PROVIDER_SITE_OTHER): Payer: Managed Care, Other (non HMO) | Admitting: Neurology

## 2023-03-25 ENCOUNTER — Encounter: Payer: Self-pay | Admitting: Neurology

## 2023-03-25 DIAGNOSIS — G43009 Migraine without aura, not intractable, without status migrainosus: Secondary | ICD-10-CM

## 2023-03-25 DIAGNOSIS — G43011 Migraine without aura, intractable, with status migrainosus: Secondary | ICD-10-CM | POA: Diagnosis not present

## 2023-03-25 DIAGNOSIS — G43001 Migraine without aura, not intractable, with status migrainosus: Secondary | ICD-10-CM

## 2023-04-07 ENCOUNTER — Telehealth: Payer: Self-pay | Admitting: Neurology

## 2023-04-07 NOTE — Telephone Encounter (Signed)
Pt called in to find out the status of her FMLA paperwork

## 2023-04-08 NOTE — Telephone Encounter (Signed)
Patient to refax forms

## 2023-04-13 ENCOUNTER — Telehealth: Payer: Self-pay | Admitting: Neurology

## 2023-04-13 NOTE — Telephone Encounter (Signed)
Patient was calling to check on her [employer] paperwork

## 2023-04-14 NOTE — Telephone Encounter (Signed)
FMLA paperwork faxed

## 2023-04-27 ENCOUNTER — Other Ambulatory Visit: Payer: Self-pay

## 2023-04-27 ENCOUNTER — Encounter (HOSPITAL_COMMUNITY): Payer: Self-pay | Admitting: Emergency Medicine

## 2023-04-27 ENCOUNTER — Emergency Department (HOSPITAL_COMMUNITY)
Admission: EM | Admit: 2023-04-27 | Discharge: 2023-04-27 | Disposition: A | Discharge status: Home / Self Care | Payer: Managed Care, Other (non HMO) | Attending: Emergency Medicine | Admitting: Emergency Medicine

## 2023-04-27 ENCOUNTER — Emergency Department (HOSPITAL_COMMUNITY): Payer: Managed Care, Other (non HMO)

## 2023-04-27 DIAGNOSIS — R0789 Other chest pain: Secondary | ICD-10-CM | POA: Insufficient documentation

## 2023-04-27 DIAGNOSIS — R079 Chest pain, unspecified: Secondary | ICD-10-CM | POA: Diagnosis present

## 2023-04-27 DIAGNOSIS — I10 Essential (primary) hypertension: Secondary | ICD-10-CM | POA: Diagnosis not present

## 2023-04-27 DIAGNOSIS — Z1152 Encounter for screening for COVID-19: Secondary | ICD-10-CM | POA: Insufficient documentation

## 2023-04-27 LAB — CBC WITH DIFFERENTIAL/PLATELET
Abs Immature Granulocytes: 0.01 10*3/uL (ref 0.00–0.07)
Basophils Absolute: 0 10*3/uL (ref 0.0–0.1)
Basophils Relative: 0 %
Eosinophils Absolute: 0.1 10*3/uL (ref 0.0–0.5)
Eosinophils Relative: 1 %
HCT: 38 % (ref 36.0–46.0)
Hemoglobin: 12 g/dL (ref 12.0–15.0)
Immature Granulocytes: 0 %
Lymphocytes Relative: 18 %
Lymphs Abs: 1.2 10*3/uL (ref 0.7–4.0)
MCH: 24.9 pg — ABNORMAL LOW (ref 26.0–34.0)
MCHC: 31.6 g/dL (ref 30.0–36.0)
MCV: 79 fL — ABNORMAL LOW (ref 80.0–100.0)
Monocytes Absolute: 0.6 10*3/uL (ref 0.1–1.0)
Monocytes Relative: 9 %
Neutro Abs: 5 10*3/uL (ref 1.7–7.7)
Neutrophils Relative %: 72 %
Platelets: 275 10*3/uL (ref 150–400)
RBC: 4.81 MIL/uL (ref 3.87–5.11)
RDW: 13.4 % (ref 11.5–15.5)
WBC: 7 10*3/uL (ref 4.0–10.5)
nRBC: 0 % (ref 0.0–0.2)

## 2023-04-27 LAB — TROPONIN I (HIGH SENSITIVITY)
Troponin I (High Sensitivity): 4 ng/L (ref <18)
Troponin I (High Sensitivity): 3 ng/L (ref <18)

## 2023-04-27 LAB — COMPREHENSIVE METABOLIC PANEL
ALT: 16 U/L (ref 0–44)
AST: 23 U/L (ref 15–41)
Albumin: 3.7 g/dL (ref 3.5–5.0)
Alkaline Phosphatase: 78 U/L (ref 38–126)
Anion gap: 10 (ref 5–15)
BUN: 6 mg/dL (ref 6–20)
CO2: 24 mmol/L (ref 22–32)
Calcium: 9.6 mg/dL (ref 8.9–10.3)
Chloride: 104 mmol/L (ref 98–111)
Creatinine, Ser: 0.83 mg/dL (ref 0.44–1.00)
GFR, Estimated: >60 mL/min (ref >60)
Glucose, Bld: 97 mg/dL (ref 70–99)
Potassium: 4.1 mmol/L (ref 3.5–5.1)
Sodium: 138 mmol/L (ref 135–145)
Total Bilirubin: 0.6 mg/dL (ref 0.3–1.2)
Total Protein: 7.5 g/dL (ref 6.5–8.1)

## 2023-04-27 LAB — RESP PANEL BY RT-PCR (RSV, FLU A&B, COVID)  RVPGX2
Influenza A by PCR: NEGATIVE
Influenza B by PCR: NEGATIVE
Resp Syncytial Virus by PCR: NEGATIVE
SARS Coronavirus 2 by RT PCR: NEGATIVE

## 2023-04-27 LAB — HCG, SERUM, QUALITATIVE: Preg, Serum: NEGATIVE

## 2023-04-27 LAB — D-DIMER, QUANTITATIVE: D-Dimer, Quant: <0.27 ug{FEU}/mL (ref 0.00–0.50)

## 2023-04-27 MED ORDER — KETOROLAC TROMETHAMINE 15 MG/ML IJ SOLN
15.0000 mg | Freq: Once | INTRAMUSCULAR | Status: AC
  Administered 2023-04-27: 15 mg via INTRAVENOUS
  Filled 2023-04-27: qty 1

## 2023-04-27 MED ORDER — FAMOTIDINE 20 MG PO TABS
20.0000 mg | ORAL_TABLET | Freq: Once | ORAL | Status: AC
  Administered 2023-04-27: 20 mg via ORAL
  Filled 2023-04-27: qty 1

## 2023-04-27 MED ORDER — ACETAMINOPHEN 500 MG PO TABS
1000.0000 mg | ORAL_TABLET | Freq: Once | ORAL | Status: AC
  Administered 2023-04-27: 1000 mg via ORAL
  Filled 2023-04-27: qty 2

## 2023-04-27 NOTE — ED Triage Notes (Signed)
Pt reports left sided chest/breast pain that started yesterday. Pt reports the pain is worse when swallowing. PT reports she has had a cough x 3 days.

## 2023-04-27 NOTE — ED Provider Notes (Signed)
[facility]MENT AT [facility] Provider Note   CSN: [id number] Arrival date & time: 04/27/23  5284     History  Chief Complaint  Patient presents with   Chest Pain    Ann Frey is a 37 y.o. female.   Chest Pain  37 year old female history of migraines and hypertension presenting for chest pain.  She states since Friday she has had cough, itchy throat, pain beneath her left breast and her left chest.  She also notes her heart rate is felt higher recently.  She is not had any fevers, cough with some mucus no hemoptysis.  No history of PE, no pain with breathing but occasionally feel somewhat short of breath.  She has not had pain like this before.  She notes is worse with swallowing but she is able to swallow okay.  No nausea or vomiting.  She has not noted any rash, swelling, or bumps within her left breast.  No abdominal pain.     Home Medications Prior to Admission medications   Medication Sig Start Date End Date Taking? Authorizing Provider  esomeprazole (NEXIUM) 40 MG capsule Take 1 capsule (40 mg total) by mouth daily at 12 noon. 02/09/21   Charlynne Pander, MD  ferrous sulfate 324 MG TBEC Take 65 mg by mouth daily.    [provider]  Galcanezumab-gnlm (EMGALITY) 120 MG/ML SOSY INJECT 120 MG SUBCUTANEOUSLY ONCE EVERY 28 DAYS 06/11/22   Everlena Cooper, Adam R, DO  KAPSPARGO SPRINKLE 50 MG CS24 Take 1 capsule by mouth daily. 06/10/22   [provider]  Multiple Vitamin (MULTI VITAMIN) TABS 1 tablet Orally Once a day    [provider]  Ubrogepant (UBRELVY) 100 MG TABS Take 1 tablet by mouth as needed (May repeat after 2 hours. Maximum 2 tablets in 24 hours.). 06/11/22   Drema Dallas, DO  vitamin B-12 (CYANOCOBALAMIN) 500 MCG tablet Take 500 mcg by mouth daily.    [provider]      Allergies    Propranolol hcl, Diltiazem, and Propranolol    Review of Systems   Review of Systems  Cardiovascular:  Positive for  chest pain.  Review of systems completed and notable as per HPI.  ROS otherwise negative.   Physical Exam Updated Vital Signs BP (!) 139/92   Pulse 75   Temp 98.2 F (36.8 C) (Oral)   Resp 17   SpO2 100%  Physical Exam Vitals and nursing note reviewed.  Constitutional:      General: She is not in acute distress.    Appearance: She is well-developed.  HENT:     Head: Normocephalic and atraumatic.  Eyes:     Conjunctiva/sclera: Conjunctivae normal.  Cardiovascular:     Rate and Rhythm: Normal rate and regular rhythm.     Pulses: Normal pulses.     Heart sounds: Normal heart sounds. No murmur heard. Pulmonary:     Effort: Pulmonary effort is normal. No respiratory distress.     Breath sounds: Normal breath sounds.  Chest:     Chest wall: No tenderness.  Abdominal:     Palpations: Abdomen is soft.     Tenderness: There is no abdominal tenderness.  Musculoskeletal:        General: No swelling.     Cervical back: Neck supple.     Right lower leg: No edema.     Left lower leg: No edema.  Skin:    General: Skin is warm and dry.  Capillary Refill: Capillary refill takes less than 2 seconds.  Neurological:     General: No focal deficit present.     Mental Status: She is alert and oriented to person, place, and time. Mental status is at baseline.  Psychiatric:        Mood and Affect: Mood normal.     ED Results / Procedures / Treatments   Labs (all labs ordered are listed, but only abnormal results are displayed) Labs Reviewed  CBC WITH DIFFERENTIAL/PLATELET - Abnormal; Notable for the following components:      Result Value   MCV 79.0 (*)    MCH 24.9 (*)    All other components within normal limits  RESP PANEL BY RT-PCR (RSV, FLU A&B, COVID)  RVPGX2  COMPREHENSIVE METABOLIC PANEL  D-DIMER, QUANTITATIVE  HCG, SERUM, QUALITATIVE  TROPONIN I (HIGH SENSITIVITY)  TROPONIN I (HIGH SENSITIVITY)    EKG EKG Interpretation Date/Time:  Monday April 27 2023 10:31:45  EDT Ventricular Rate:  87 PR Interval:  162 QRS Duration:  74 QT Interval:  348 QTC Calculation: 419 R Axis:   46  Text Interpretation: Sinus rhythm No significant change since last tracing Confirmed by Fulton Reek [phone]) on 04/27/2023 10:42:43 AM  Radiology DG Chest 2 View  Result Date: 04/27/2023 CLINICAL DATA:  Chest pain. EXAM: CHEST - 2 VIEW COMPARISON:  X-ray 12/22/2022 and older FINDINGS: No consolidation, pneumothorax or effusion. No edema. Normal cardiopericardial silhouette. Overlapping cardiac leads. IMPRESSION: No acute cardiopulmonary disease. Electronically Signed   By: Karen Kays M.D.   On: 04/27/2023 12:04    Procedures Procedures    Medications Ordered in ED Medications  acetaminophen (TYLENOL) tablet 1,000 mg (1,000 mg Oral Given 04/27/23 1012)  famotidine (PEPCID) tablet 20 mg (20 mg Oral Given 04/27/23 1012)  ketorolac (TORADOL) 15 MG/ML injection 15 mg (15 mg Intravenous Given 04/27/23 1153)    ED Course/ Medical Decision Making/ A&P              HEART Score: 1                    Medical Decision Making Amount and/or Complexity of Data Reviewed Labs: ordered. Radiology: ordered.  Risk OTC drugs. Prescription drug management.   Medical Decision Making:   Ann Frey is a 37 y.o. female who presented to the ED today with chest pain, cough.  Vital signs reviewed.  On exam she is well-appearing.  She reports atypical nonexertional chest pain with recent cough.  Could be viral infection.  Patient somewhat worse with swallowing which suggest possible GI component.  Will treat for possible GERD.  She is relatively low risk for ACS, will obtain EKG, troponin.  Also obtain D-dimer given reported elevated heart rate at home.  Lungs are clear here.  Also any skin changes, rash, localized swelling.  She says her pain seems to be under her left breast in her chest.  Low suspicion for dissection based on history and exam.   Patient placed on  continuous vitals and telemetry monitoring while in ED which was reviewed periodically.  Reviewed and confirmed nursing documentation for past medical history, family history, social history.  Reassessment and Plan:   Hear score of 1.  On reassessment she is feeling somewhat better.  Lab work is reassuring, troponin negative x 2 and no EKG changes compared to prior low concern for ACS.  D-dimer is negative, low concern for PE.  She says the pain is primarily with swallowing but  she is swallowing well.  Wonder if there is a gastrointestinal component.  Recommend she follow close with her PCP.  Discharged with return precautions.    Patient's presentation is most consistent with acute complicated illness / injury requiring diagnostic workup.           Final Clinical Impression(s) / ED Diagnoses Final diagnoses:  Atypical chest pain    Rx / DC Orders ED Discharge Orders     None         Laurence Spates, MD 04/27/23 1720

## 2023-04-27 NOTE — Discharge Instructions (Signed)
You were seen today for chest pain.  Your lab work today was reassuring as well as your x-ray.  I am not sure what is causing your pain, it could be related to reflux or gastrointestinal pain.  I recommend you follow close with your primary care doctor.  If you develop worsening pain, difficulty breathing or any other new concerning symptoms you should return to the ED.

## 2023-04-27 NOTE — ED Notes (Signed)
Pt states no change in pain, 8/10  EDP aware

## 2023-05-10 ENCOUNTER — Encounter (HOSPITAL_COMMUNITY): Payer: Self-pay

## 2023-05-10 ENCOUNTER — Emergency Department (HOSPITAL_COMMUNITY)
Admission: EM | Admit: 2023-05-10 | Discharge: 2023-05-11 | Disposition: A | Discharge status: Home / Self Care | Payer: Managed Care, Other (non HMO) | Attending: Emergency Medicine | Admitting: Emergency Medicine

## 2023-05-10 ENCOUNTER — Other Ambulatory Visit: Payer: Self-pay

## 2023-05-10 DIAGNOSIS — R030 Elevated blood-pressure reading, without diagnosis of hypertension: Secondary | ICD-10-CM | POA: Diagnosis not present

## 2023-05-10 DIAGNOSIS — R531 Weakness: Secondary | ICD-10-CM | POA: Insufficient documentation

## 2023-05-10 DIAGNOSIS — Z20822 Contact with and (suspected) exposure to covid-19: Secondary | ICD-10-CM | POA: Insufficient documentation

## 2023-05-10 LAB — COMPREHENSIVE METABOLIC PANEL
ALT: 15 U/L (ref 0–44)
AST: 24 U/L (ref 15–41)
Albumin: 4.1 g/dL (ref 3.5–5.0)
Alkaline Phosphatase: 73 U/L (ref 38–126)
Anion gap: 9 (ref 5–15)
BUN: 13 mg/dL (ref 6–20)
CO2: 23 mmol/L (ref 22–32)
Calcium: 9.6 mg/dL (ref 8.9–10.3)
Chloride: 104 mmol/L (ref 98–111)
Creatinine, Ser: 0.88 mg/dL (ref 0.44–1.00)
GFR, Estimated: >60 mL/min (ref >60)
Glucose, Bld: 102 mg/dL — ABNORMAL HIGH (ref 70–99)
Potassium: 3.7 mmol/L (ref 3.5–5.1)
Sodium: 136 mmol/L (ref 135–145)
Total Bilirubin: 0.4 mg/dL (ref 0.3–1.2)
Total Protein: 8.1 g/dL (ref 6.5–8.1)

## 2023-05-10 LAB — CBC WITH DIFFERENTIAL/PLATELET
Abs Immature Granulocytes: 0.01 10*3/uL (ref 0.00–0.07)
Basophils Absolute: 0 10*3/uL (ref 0.0–0.1)
Basophils Relative: 0 %
Eosinophils Absolute: 0.1 10*3/uL (ref 0.0–0.5)
Eosinophils Relative: 1 %
HCT: 37 % (ref 36.0–46.0)
Hemoglobin: 12 g/dL (ref 12.0–15.0)
Immature Granulocytes: 0 %
Lymphocytes Relative: 31 %
Lymphs Abs: 2 10*3/uL (ref 0.7–4.0)
MCH: 25.5 pg — ABNORMAL LOW (ref 26.0–34.0)
MCHC: 32.4 g/dL (ref 30.0–36.0)
MCV: 78.7 fL — ABNORMAL LOW (ref 80.0–100.0)
Monocytes Absolute: 0.6 10*3/uL (ref 0.1–1.0)
Monocytes Relative: 9 %
Neutro Abs: 3.7 10*3/uL (ref 1.7–7.7)
Neutrophils Relative %: 59 %
Platelets: 300 10*3/uL (ref 150–400)
RBC: 4.7 MIL/uL (ref 3.87–5.11)
RDW: 13.5 % (ref 11.5–15.5)
WBC: 6.3 10*3/uL (ref 4.0–10.5)
nRBC: 0 % (ref 0.0–0.2)

## 2023-05-10 LAB — RESP PANEL BY RT-PCR (RSV, FLU A&B, COVID)  RVPGX2
Influenza A by PCR: NEGATIVE
Influenza B by PCR: NEGATIVE
Resp Syncytial Virus by PCR: NEGATIVE
SARS Coronavirus 2 by RT PCR: NEGATIVE

## 2023-05-10 LAB — HCG, QUANTITATIVE, PREGNANCY: hCG, Beta Chain, Quant, S: <1 m[IU]/mL (ref <5)

## 2023-05-10 NOTE — ED Triage Notes (Signed)
Pt BIB EMS, pt drank a herbal tea around 3 pm and started feeling weak 2 hrs later. Pt started her menstrual cycle today as well.

## 2023-05-10 NOTE — ED Provider Notes (Signed)
[facility]-EMERGENCY DEPT [facility] Emergency Department Provider Note MRN:  [id number]  Arrival date & time: 05/11/23     Chief Complaint   Weakness   History of Present Illness   Ann Frey is a 37 y.o. year-old female presents to the ED with chief complaint of feeling weak and dizzy that started around 5pm tonight.  Denies any other symptoms. She denies cough, fever, chills, vomiting, diarrhea, dysuria.  Denies any treatments PTA.  History provided by patient.   Review of Systems  Pertinent positive and negative review of systems noted in HPI.    Physical Exam   Vitals:   05/10/23 2300 05/10/23 2302  BP: (!) 152/95 (!) 168/103  Pulse:  71  Resp: 11 12  Temp:  98.5 F (36.9 C)  SpO2:  100%    CONSTITUTIONAL:  well-appearing, NAD NEURO:  Alert and oriented x 3, CN 3-12 grossly intact EYES:  eyes equal and reactive ENT/NECK:  Supple, no stridor  CARDIO:  normal rate, regular rhythm, appears well-perfused  PULM:  No respiratory distress, CTAB GI/GU:  non-distended,  MSK/SPINE:  No gross deformities, no edema, moves all extremities  SKIN:  no rash, atraumatic   *Additional and/or pertinent findings included in MDM below  Diagnostic and Interventional Summary    EKG Interpretation Date/Time:    Ventricular Rate:    PR Interval:    QRS Duration:    QT Interval:    QTC Calculation:   R Axis:      Text Interpretation:         Labs Reviewed  CBC WITH DIFFERENTIAL/PLATELET - Abnormal; Notable for the following components:      Result Value   MCV 78.7 (*)    MCH 25.5 (*)    All other components within normal limits  COMPREHENSIVE METABOLIC PANEL - Abnormal; Notable for the following components:   Glucose, Bld 102 (*)    All other components within normal limits  RESP PANEL BY RT-PCR (RSV, FLU A&B, COVID)  RVPGX2  HCG, QUANTITATIVE, PREGNANCY    No orders to display    Medications - No data to display   Procedures  /  Critical  Care Procedures  ED Course and Medical Decision Making  I have reviewed the triage vital signs, the nursing notes, and pertinent available records from the EMR.  Social Determinants Affecting Complexity of Care: Patient has no clinically significant social determinants affecting this chief complaint..   ED Course: Clinical Course as of 05/11/23 0014  Mon May 11, 2023  0009 Resp panel by RT-PCR (RSV, Flu A&B, Covid) Anterior Nasal Swab [RB]  0009 Comprehensive metabolic panel(!) No significant electrolyte abnormality [RB]  0009 CBC with Differential(!) No leukocytosis or anemia [RB]  0010 hCG, quantitative, pregnancy Not pregnant [RB]    Clinical Course User Index [RB] Roxy Horseman, PA-C    Medical Decision Making Patient here with generalized weakness and fatigue.  Has also felt dizzy/lightheaded.    Her BP is noted to be high.  She has been compliant with her BP meds, but might need additional therapy added.  She will discuss with her doctor if BP remains high.  No infectious symptoms.  Patient doesn't seem to be in any distress.    We discussed that she may need additional PCP workup, thyroid, b12, etc.  She will follow-up outpatient.  Return precautions discussed.   Problems Addressed: Elevated blood pressure reading: chronic illness or injury Generalized weakness: acute illness or injury  Amount and/or Complexity of  Data Reviewed Labs: ordered. Decision-making details documented in ED Course. ECG/medicine tests: ordered and independent interpretation performed.    Details: NSR         Consultants: No consultations were needed in caring for this patient.   Treatment and Plan: Emergency department workup does not suggest an emergent condition requiring admission or immediate intervention beyond  what has been performed at this time. The patient is safe for discharge and has  been instructed to return immediately for worsening symptoms, change in  symptoms  or any other concerns    Final Clinical Impressions(s) / ED Diagnoses     ICD-10-CM   1. Generalized weakness  R53.1     2. Elevated blood pressure reading  R03.0       ED Discharge Orders     None         Discharge Instructions Discussed with and Provided to Patient:   Discharge Instructions   None      Roxy Horseman, PA-C 05/11/23 0014    Gilda Crease, MD 05/11/23 [phone]

## 2023-05-18 ENCOUNTER — Other Ambulatory Visit: Payer: Self-pay | Admitting: Neurology

## 2023-06-03 ENCOUNTER — Other Ambulatory Visit: Payer: Self-pay | Admitting: Neurology

## 2023-08-03 ENCOUNTER — Telehealth: Payer: Self-pay

## 2023-08-03 NOTE — Telephone Encounter (Signed)
PA needed   [employer]

## 2023-08-12 ENCOUNTER — Telehealth: Payer: Self-pay | Admitting: Neurology

## 2023-08-12 NOTE — Telephone Encounter (Signed)
 PA status, Patient called for an update.

## 2023-08-12 NOTE — Telephone Encounter (Signed)
 Patient called b/c her pharmacy called to let her know the Emgality  need a PA. Pt is aware sheena knows and is working on it

## 2023-08-12 NOTE — Telephone Encounter (Signed)
 Status message sent to the PA team.  Will update patient once received.

## 2023-08-17 NOTE — Telephone Encounter (Signed)
 Okay to stop by for a sample.

## 2023-08-17 NOTE — Telephone Encounter (Signed)
 Caller would like to know if its possible to get samples until PA is confirmed?

## 2023-08-27 NOTE — Telephone Encounter (Signed)
 Pt called in inquiring about the status of the PA for the [facility].

## 2023-08-31 NOTE — Telephone Encounter (Signed)
 PA status

## 2023-09-01 ENCOUNTER — Other Ambulatory Visit (HOSPITAL_COMMUNITY): Payer: Self-pay

## 2023-09-01 ENCOUNTER — Telehealth: Payer: Self-pay | Admitting: Pharmacy Technician

## 2023-09-01 MED ORDER — EMGALITY 120 MG/ML ~~LOC~~ SOAJ: SUBCUTANEOUS | Status: DC

## 2023-09-01 NOTE — Addendum Note (Signed)
 Addended by: Karl Luke A on: 09/01/2023 10:13 AM   Modules accepted: Orders

## 2023-09-01 NOTE — Telephone Encounter (Signed)
 Pharmacy Patient Advocate Encounter   Received notification from Physician's Office that prior authorization for [facility] 120MG  is required/requested.   Insurance verification completed.   The patient is insured through [employer] .   Per test claim: PA required; PA submitted to above mentioned insurance via CoverMyMeds Key/confirmation #/EOC [id number] Status is pending

## 2023-09-02 ENCOUNTER — Other Ambulatory Visit: Payer: Self-pay

## 2023-09-02 ENCOUNTER — Other Ambulatory Visit (HOSPITAL_COMMUNITY): Payer: Self-pay

## 2023-09-02 MED ORDER — EMGALITY 120 MG/ML ~~LOC~~ SOSY: PREFILLED_SYRINGE | SUBCUTANEOUS | 4 refills | Status: DC

## 2023-09-02 NOTE — Telephone Encounter (Signed)
 Pharmacy Patient Advocate Encounter  Received notification from [employer] that Prior Authorization for [facility] 120MG  has been APPROVED from 2.25.25 to 2.26.26. Ran test claim, Copay is $30. This test claim was processed through [facility]- copay amounts may vary at other pharmacies due to pharmacy/plan contracts, or as the patient moves through the different stages of their insurance plan.   PA #/Case ID/Reference #: [id number]

## 2023-09-21 NOTE — Progress Notes (Unsigned)
 Virtual Visit via Video Note  Consent was obtained for video visit:  Yes.   Answered questions that patient had about telehealth interaction:  Yes.   I discussed the limitations, risks, security and privacy concerns of performing an evaluation and management service by telemedicine. I also discussed with the patient that there may be a patient responsible charge related to this service. The patient expressed understanding and agreed to proceed.  Pt location: Home Physician Location: office Name of referring provider:  No ref. provider found I connected with Ann Frey at patients initiation/request on 09/22/2023 at 10:10 AM EDT by video enabled telemedicine application and verified that I am speaking with the correct person using two identifiers. Pt MRN:  [id number] Pt DOB:  1985-11-19 Video Participants:  Ann Frey    Assessment/Plan:   Migraine without aura, with status migrainosus, intractable - aggravated by increased psychosocial stressors Migraine with aura, without status migrainosus, not intractable    1.Migraine prevention:   Emgality, will start taking every 28 days to see if maintains efficacy 2.Migraine rescue:  Ubrelvy 100mg  - effective now, but still has sample of Reyvow if needed.  3. Limit use of pain relievers to no more than 2 days out of week to prevent risk of rebound or medication-overuse headache. 4. Keep headache diary 5. Follow up 6 months    Subjective:  Ann Frey is a 38 year old right-handed woman with sinus tachycardia who follows up for migraines.    UPDATE: Doing well.   Intensity:  moderate Duration:  couple of hours Frequency:  1 to 4 migraines a month.    Current NSAIDS: none Current analgesics: None Current triptans: none Current ergotamine: None Current anti-emetic: None Current muscle relaxants: None Current anti-anxiolytic: None Current sleep aide: None Current Antihypertensive medications: Cardizem Current  Antidepressant medications: escitalopram 10mg  daily Current Anticonvulsant medications: none Current anti-CGRP: Emgality every 21 days,  Ubrelvy 100mg  Current Vitamins/Herbal/Supplements: None Current Antihistamines/Decongestants: None Other therapy: Reyvow (samples - not used)   Caffeine: No coffee Alcohol: No Smoker: No Diet: Drinks plenty of water Exercise: Walks daily Depression: No; Anxiety: Yes Other pain: None times neck pain Sleep hygiene: Good   HISTORY:  She began having headaches intermittently since 2013.  Location:  [place]/forehead bilateral.  Sometimes preceded by ear ache.  Quality:  pounding.  Initial intensity:  10/10.  Aura:  no.  Prodrome:  no.  Associated symptoms:  Spinning sensation, nausea, phonophobia.  Whitening/fogginess of vision.  No vomiting, diplopia, slurred speech, focal numbness or weakness.  Initial Duration:  3 days.  Initial Frequency:  Twice a year but has daily dull headaches.  Triggers:  Fatigue.  Also chemicals from cleaning fluids.  Relieving factors:  None.  Activity:  Goes to hospital when severe.       Past NSAIDS:  naproxen Past analgesics:  Tylenol Past abortive triptans:  sumatriptan (did not tolerate); rizatriptan (palpitations) Past abortive ergotamine:  no Past muscle relaxants:  no Past anti-emetic:  no Past antihypertensive medications:  Propranolol ER 80mg  (shortness of breath and rash), metoprolol (lightheadedness) Past antidepressant medications:  Nortriptyline 10mg  (chest pain) Past anticonvulsant medications:  topiramate 150mg  (irritability, memory deficits, dry mouth, paresthesias) Past anti-CGRP:  Aimovig (constipation) Past vitamins/Herbal/Supplements:  no Past antihistamines/decongestants:  no Other past therapies:  no   She has history of other somatic symptoms, particularly chest pain, palpitations and shortness of breath, which has been worked up by cardiology and testing negative.   07/28/17.  She developed sudden  tingling of the entire upper and lower face which then involved the entire body with burning and redness in the hands.  She had associated generalized weakness, spinning sensation, nausea, shortness of breath and visual disturbance described as blurred vision with dimming of vision.  She denied headache at the time.  Head CT was personally reviewed and was unremarkable.  EKG and labs were unremarkable.  Since then, she continues to feel generalized weakness, dysesthesias of the face and entire body/extremities and lightheadedness.  MRI of brain with and without contrast was performed on 08/19/17, which was personally reviewed and demonstrated nonspecific mild scattered small subcortical white matter foci, likely related to migraine, and incidental developmental venous anomaly  Otherwise, no acute abnormalities.  Topiramate was discontinued but spells persisted.  Past Medical History: Past Medical History:  Diagnosis Date   Hypertension    Migraines    No pertinent past medical history     Medications: Outpatient Encounter Medications as of 09/22/2023  Medication Sig   esomeprazole (NEXIUM) 40 MG capsule Take 1 capsule (40 mg total) by mouth daily at 12 noon.   ferrous sulfate 324 MG TBEC Take 65 mg by mouth daily.   Galcanezumab-gnlm (EMGALITY) 120 MG/ML SOAJ Medication Samples have been provided to the patient.  Drug name: Emgality        Strength: 120mg         Qty: 1 box  LOT: [id number]  Exp.Date: 01/12/2025  Dosing instructions:   The patient has been instructed regarding the correct time, dose, and frequency of taking this medication, including desired effects and most common side effects.   Mahina A Allen 10:11 AM 09/01/2023   Galcanezumab-gnlm (EMGALITY) 120 MG/ML SOSY INJECT 120 MG SUBCUTANEOUSLY ONCE EVERY 28 DAYS   KAPSPARGO SPRINKLE 50 MG CS24 Take 1 capsule by mouth daily.   Multiple Vitamin (MULTI VITAMIN) TABS 1 tablet Orally Once a day   Ubrogepant (UBRELVY) 100 MG TABS TAKE  ONE TABLET BY MOUTH AS NEEDED ( MAY REPEAT AFTER 2 HOURS. MAXIMUM 2 TABLETS IN 24 HOURS)   vitamin B-12 (CYANOCOBALAMIN) 500 MCG tablet Take 500 mcg by mouth daily.   No facility-administered encounter medications on file as of 09/22/2023.    Allergies: Allergies  Allergen Reactions   Propranolol Hcl    Diltiazem Hives   Propranolol Palpitations    Family History: Family History  Problem Relation Age of Onset   Hypertension Father    Other Father        "Big heart"   Diabetes Mother    Hypertension Mother    Asthma Sister    Other Paternal Grandfather        "Big heart"    Observations/Objective:   No acute distress.  Alert and oriented.  Speech fluent and not dysarthric.  Language intact.    Follow Up Instructions:    -I discussed the assessment and treatment plan with the patient. The patient was provided an opportunity to ask questions and all were answered. The patient agreed with the plan and demonstrated an understanding of the instructions.   The patient was advised to call back or seek an in-person evaluation if the symptoms worsen or if the condition fails to improve as anticipated.   Cira Servant, DO    CC: Leodis Sias, MD

## 2023-09-22 ENCOUNTER — Encounter: Payer: Self-pay | Admitting: Neurology

## 2023-09-22 ENCOUNTER — Telehealth: Payer: Managed Care, Other (non HMO) | Admitting: Neurology

## 2023-09-22 DIAGNOSIS — G43009 Migraine without aura, not intractable, without status migrainosus: Secondary | ICD-10-CM | POA: Diagnosis not present

## 2023-09-22 DIAGNOSIS — G43001 Migraine without aura, not intractable, with status migrainosus: Secondary | ICD-10-CM | POA: Diagnosis not present

## 2024-01-07 ENCOUNTER — Other Ambulatory Visit: Payer: Self-pay

## 2024-01-07 MED ORDER — EMGALITY 120 MG/ML ~~LOC~~ SOAJ: SUBCUTANEOUS | Status: DC

## 2024-01-19 ENCOUNTER — Other Ambulatory Visit (HOSPITAL_COMMUNITY): Payer: Self-pay

## 2024-01-23 ENCOUNTER — Other Ambulatory Visit: Payer: Self-pay | Admitting: Neurology

## 2024-01-25 ENCOUNTER — Other Ambulatory Visit: Payer: Self-pay | Admitting: Neurology

## 2024-02-23 ENCOUNTER — Other Ambulatory Visit: Payer: Self-pay | Admitting: Neurology

## 2024-03-02 ENCOUNTER — Other Ambulatory Visit: Payer: Self-pay

## 2024-03-02 MED ORDER — EMGALITY 120 MG/ML ~~LOC~~ SOAJ: 120.0000 mg | SUBCUTANEOUS | 0 refills | Status: DC

## 2024-03-15 ENCOUNTER — Other Ambulatory Visit (HOSPITAL_COMMUNITY): Payer: Self-pay

## 2024-03-15 ENCOUNTER — Telehealth: Payer: Self-pay

## 2024-03-15 NOTE — Telephone Encounter (Signed)
 Pharmacy Patient Advocate Encounter   Received notification from CoverMyMeds that prior authorization for Ubrelvy  100MG  tablets is required/requested.   Insurance verification completed.   The patient is insured through [employer] .   Prior Authorization for Ubrelvy  100MG  tablets has been APPROVED from 03-15-2024 to 03-15-2025   PA #/Case ID/Reference #: [id number]

## 2024-03-23 NOTE — Progress Notes (Unsigned)
 NEUROLOGY FOLLOW UP OFFICE NOTE  Rayven Rettig [id number]  Assessment/Plan:   Migraine without aura, with status migrainosus, intractable - aggravated by increased psychosocial stressors Migraine with aura, without status migrainosus, not intractable Elevated blood pressure    1.Migraine prevention:   Emgality  every 28 days  2.Migraine rescue:  Ubrelvy  100mg .  Has sample of Reyvow to try as backup if needed. 3. Limit use of pain relievers to no more than 9 days out of the month to prevent risk of rebound or medication-overuse headache. 4. Keep headache diary 5. Follow up with PCP regarding blood pressure 6. Follow up 1 year   Subjective:  Ayleen Mckinstry is a 38 year old right-handed woman with sinus tachycardia who follows up for migraines.    UPDATE: Doing well.   Intensity:  moderate Duration:  couple of hours with Ubrelvy .  Frequency:  1 to 2 migraines a month.    Current NSAIDS: none Current analgesics: None Current triptans: none Current ergotamine: None Current anti-emetic: None Current muscle relaxants: None Current anti-anxiolytic: None Current sleep aide: None Current Antihypertensive medications: Cardizem  Current Antidepressant medications: escitalopram 10mg  daily Current Anticonvulsant medications: none Current anti-CGRP: Emgality  every 21 days,  Ubrelvy  100mg  Current Vitamins/Herbal/Supplements: None Current Antihistamines/Decongestants: None Other therapy: Reyvow (samples - not used)   Caffeine: No coffee Alcohol: No Smoker: No Diet: Drinks plenty of water Exercise: Walks daily Depression: No; Anxiety: Yes Other pain: None times neck pain Sleep hygiene: Good   HISTORY:  She began having headaches intermittently since 2013.  Location:  [place]/forehead bilateral.  Sometimes preceded by ear ache.  Quality:  pounding.  Initial intensity:  10/10.  Aura:  no.  Prodrome:  no.  Associated symptoms:  Spinning sensation, nausea, phonophobia.   Whitening/fogginess of vision.  No vomiting, diplopia, slurred speech, focal numbness or weakness.  Initial Duration:  3 days.  Initial Frequency:  Twice a year but has daily dull headaches.  Triggers:  Fatigue.  Also chemicals from cleaning fluids.  Relieving factors:  None.  Activity:  Goes to hospital when severe.       Past NSAIDS:  naproxen  Past analgesics:  Tylenol  Past abortive triptans:  sumatriptan  (did not tolerate); rizatriptan  (palpitations) Past abortive ergotamine:  no Past muscle relaxants:  no Past anti-emetic:  no Past antihypertensive medications:  Propranolol  ER 80mg  (shortness of breath and rash), metoprolol  (lightheadedness) Past antidepressant medications:  Nortriptyline  10mg  (chest pain) Past anticonvulsant medications:  topiramate  150mg  (irritability, memory deficits, dry mouth, paresthesias) Past anti-CGRP:  Aimovig  (constipation) Past vitamins/Herbal/Supplements:  no Past antihistamines/decongestants:  no Other past therapies:  no   She has history of other somatic symptoms, particularly chest pain, palpitations and shortness of breath, which has been worked up by cardiology and testing negative.   07/28/17.  She developed sudden tingling of the entire upper and lower face which then involved the entire body with burning and redness in the hands.  She had associated generalized weakness, spinning sensation, nausea, shortness of breath and visual disturbance described as blurred vision with dimming of vision.  She denied headache at the time.  Head CT was personally reviewed and was unremarkable.  EKG and labs were unremarkable.  Since then, she continues to feel generalized weakness, dysesthesias of the face and entire body/extremities and lightheadedness.  MRI of brain with and without contrast was performed on 08/19/17, which was personally reviewed and demonstrated nonspecific mild scattered small subcortical white matter foci, likely related to migraine, and incidental  developmental venous anomaly  Otherwise,  no acute abnormalities.  Topiramate  was discontinued but spells persisted.  PAST MEDICAL HISTORY: Past Medical History:  Diagnosis Date   Hypertension    Migraines    No pertinent past medical history     MEDICATIONS: Current Outpatient Medications on File Prior to Visit  Medication Sig Dispense Refill   esomeprazole  (NEXIUM ) 40 MG capsule Take 1 capsule (40 mg total) by mouth daily at 12 noon. 30 capsule 0   ferrous sulfate 324 MG TBEC Take 65 mg by mouth daily.     Galcanezumab -gnlm (EMGALITY ) 120 MG/ML SOAJ Inject 120 mg into the skin every 28 (twenty-eight) days. 1.12 mL 0   KAPSPARGO  SPRINKLE 50 MG CS24 Take 1 capsule by mouth daily.     Multiple Vitamin (MULTI VITAMIN) TABS 1 tablet Orally Once a day     UBRELVY  100 MG TABS TAKE 1 TABLET BY MOUTH AS NEEDED (MAY  REPEAT  AFTER  2  HOURS,  MAX  2  TABLETS  IN  24  HOURS 10 tablet 0   vitamin B-12 (CYANOCOBALAMIN) 500 MCG tablet Take 500 mcg by mouth daily.     No current facility-administered medications on file prior to visit.    ALLERGIES: Allergies  Allergen Reactions   Propranolol  Hcl    Diltiazem  Hives   Propranolol  Palpitations    FAMILY HISTORY: Family History  Problem Relation Age of Onset   Hypertension Father    Other Father        Big heart   Diabetes Mother    Hypertension Mother    Asthma Sister    Other Paternal Grandfather        Big heart      Objective:  Blood pressure (!) 151/83, pulse 91, height 5' 3 (1.6 m), weight 227 lb (103 kg), SpO2 100%. General: No acute distress.  Patient appears well-groomed.   Head:  Normocephalic/atraumatic Eyes:  Fundi examined but not visualized Neck: supple, no paraspinal tenderness, full range of motion Heart:  Regular rate and rhythm Neurological Exam: alert and oriented.  Speech fluent and not dysarthric, language intact.  CN II-XII intact. Bulk and tone normal, muscle strength 5/5 throughout.  Sensation to light  touch intact.  Deep tendon reflexes 2+ throughout, toes downgoing.  Finger to nose testing intact.  Gait normal, Romberg negative.   Juliene Dunnings, DO  CC: Mabel Pleas, [place]

## 2024-03-24 ENCOUNTER — Ambulatory Visit: Admitting: Neurology

## 2024-03-24 ENCOUNTER — Encounter: Payer: Self-pay | Admitting: Neurology

## 2024-03-24 VITALS — BP 151/83 | HR 91 | Ht 63.0 in | Wt 227.0 lb

## 2024-03-24 DIAGNOSIS — G43001 Migraine without aura, not intractable, with status migrainosus: Secondary | ICD-10-CM | POA: Diagnosis not present

## 2024-03-24 MED ORDER — UBRELVY 100 MG PO TABS: 100.0000 mg | ORAL_TABLET | ORAL | 5 refills | Status: AC | PRN

## 2024-03-24 MED ORDER — EMGALITY 120 MG/ML ~~LOC~~ SOAJ: 120.0000 mg | SUBCUTANEOUS | 5 refills | Status: AC

## 2024-03-24 NOTE — Patient Instructions (Addendum)
  Emgality  Take Ubrelvy  at earliest onset of headache.  May repeat dose once in 2 hours if needed.  Maximum 2 tablets in 24 hours.  As backup, may use Reyvow (only one in 24 hours -advised not to drive for 8 hours after use) Limit use of pain relievers to no more than 9 days out of the month.  These medications include acetaminophen , NSAIDs (ibuprofen /Advil /Motrin , naproxen /Aleve , triptans (Imitrex /sumatriptan ), Excedrin, and narcotics.  This will help reduce risk of rebound headaches. Be aware of common food triggers:  - Caffeine:  coffee, black tea, cola, Mt. Dew  - Chocolate  - Dairy:  aged cheeses (brie, blue, cheddar, gouda, Parmasan, provolone, romano, Swiss, etc), chocolate milk, buttermilk, sour cream, limit eggs and yogurt  - Nuts, peanut butter  - Alcohol  - Cereals/grains:  FRESH breads (fresh bagels, sourdough, doughnuts), yeast productions  - Processed/canned/aged/cured meats (pre-packaged deli meats, hotdogs)  - MSG/glutamate:  soy sauce, flavor enhancer, pickled/preserved/marinated foods  - Sweeteners:  aspartame (Equal, Nutrasweet).  Sugar and Splenda are okay  - Vegetables:  legumes (lima beans, lentils, snow peas, fava beans, pinto peans, peas, garbanzo beans), sauerkraut, onions, olives, pickles  - Fruit:  avocados, bananas, citrus fruit (orange, lemon, grapefruit), mango  - Other:  Frozen meals, macaroni and cheese Routine exercise Stay adequately hydrated (aim for 64 oz water daily) Keep headache diary Maintain proper stress management Maintain proper sleep hygiene Do not skip meals Consider supplements:  magnesium  citrate 400mg  daily, riboflavin 400mg  daily, coenzyme Q10 100mg  three times daily.

## 2024-03-30 ENCOUNTER — Telehealth: Payer: Self-pay | Admitting: Pharmacy Technician

## 2024-03-30 NOTE — Telephone Encounter (Signed)
 Pharmacy Patient Advocate Encounter   Received notification from Fax that prior authorization for EMGALITY  120MG  is required/requested.   Insurance verification completed.   The patient is insured through [employer] .   Per test claim: PA required; PA submitted to above mentioned insurance via Latent Key/confirmation #/EOC [id number] Status is pending

## 2024-04-01 NOTE — Telephone Encounter (Signed)
 Pharmacy Patient Advocate Encounter  Received notification from [employer] that Prior Authorization for Emgality  120MG /ML syringes (migraine) has been APPROVED from 03-30-2024 to 03-30-2025   PA #/Case ID/Reference #: [id number]

## 2025-03-24 ENCOUNTER — Ambulatory Visit: Admitting: Neurology
